# Patient Record
Sex: Female | Born: 2000 | Hispanic: Yes | Marital: Single | Smoking: Never smoker
Health system: Southern US, Community
[De-identification: ages and names within clinical notes are randomized; demographics above are authoritative.]

## PROBLEM LIST (undated history)

## (undated) DIAGNOSIS — Q676 Pectus excavatum: Secondary | ICD-10-CM

## (undated) DIAGNOSIS — D270 Benign neoplasm of right ovary: Principal | ICD-10-CM

## (undated) DIAGNOSIS — R1900 Intra-abdominal and pelvic swelling, mass and lump, unspecified site: Secondary | ICD-10-CM

## (undated) DIAGNOSIS — M419 Scoliosis, unspecified: Secondary | ICD-10-CM

## (undated) DIAGNOSIS — Z789 Other specified health status: Secondary | ICD-10-CM

## (undated) HISTORY — DX: Pectus excavatum: Q67.6

## (undated) HISTORY — DX: Benign neoplasm of right ovary: D27.0

## (undated) HISTORY — DX: Other specified health status: Z78.9

## (undated) HISTORY — DX: Scoliosis, unspecified: M41.9

## (undated) HISTORY — PX: NO PAST SURGERIES: SHX2092

---
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
## 2018-01-01 ENCOUNTER — Ambulatory Visit (HOSPITAL_COMMUNITY)
Admission: RE | Admit: 2018-01-01 | Discharge: 2018-01-01 | Disposition: A | Discharge status: Home / Self Care | Payer: Medicaid Other | Source: Ambulatory Visit | Attending: Obstetrics and Gynecology | Admitting: Obstetrics and Gynecology

## 2018-01-01 ENCOUNTER — Other Ambulatory Visit: Payer: Self-pay | Admitting: Obstetrics and Gynecology

## 2018-01-01 DIAGNOSIS — N83201 Unspecified ovarian cyst, right side: Secondary | ICD-10-CM

## 2018-01-02 ENCOUNTER — Telehealth: Payer: Self-pay | Admitting: General Practice

## 2018-01-02 DIAGNOSIS — N83201 Unspecified ovarian cyst, right side: Secondary | ICD-10-CM

## 2018-01-02 NOTE — Telephone Encounter (Signed)
Called patient with pac[employer] interpreter [phone] & informed her of results and need to come in for blood work. Also discussed someone from the front office will contact her with a follow up appt. Patient verbalized understanding to all and states she can come tomorrow at 2pm. Patient had no questions. Orders placed.

## 2018-01-02 NOTE — Telephone Encounter (Signed)
-----   Message from Chancy Milroy, MD sent at 01/01/2018  2:51 PM EST ----- Please let pt know that the ovarian cyst remains. She needs to come in for blood work and additional management after blood work returns Please draw AFP, LDH, CA 125, CEA and inhibin. Thanks Legrand Como

## 2018-01-03 ENCOUNTER — Other Ambulatory Visit: Payer: Medicaid Other

## 2018-01-03 DIAGNOSIS — N83201 Unspecified ovarian cyst, right side: Secondary | ICD-10-CM

## 2018-01-08 LAB — AFP TUMOR MARKER

## 2018-01-08 LAB — CEA: CEA: 0.6 ng/mL (ref 0.0–4.7)

## 2018-01-08 LAB — LACTATE DEHYDROGENASE: LDH: 175 IU/L (ref 119–226)

## 2018-01-08 LAB — INHIBIN A: Inhibin A, Ultrasensitive: 6.3 pg/mL

## 2018-01-08 LAB — INHIBIN B: Inhibin B: 31.8 pg/mL

## 2018-01-08 LAB — CA 125: Cancer Antigen (CA) 125: 23.6 U/mL (ref 0.0–38.1)

## 2018-01-09 ENCOUNTER — Telehealth: Payer: Self-pay | Admitting: *Deleted

## 2018-01-09 ENCOUNTER — Telehealth: Payer: Self-pay

## 2018-01-09 ENCOUNTER — Telehealth: Payer: Self-pay | Admitting: Obstetrics and Gynecology

## 2018-01-09 NOTE — Telephone Encounter (Signed)
VM received from Dr Rip Harbour regarding referral for patient to see Dr Denman George.  Will notify Joylene John NP to review and then return his call with appt.

## 2018-01-09 NOTE — Telephone Encounter (Signed)
Called the patient using [employer], gave the patient the date/time and address for the appt on 11/27 at [place] at 8:30am.

## 2018-01-09 NOTE — Telephone Encounter (Signed)
Pt contacted with recent U/S and lab results. Discussed with pt that d/t to right ovarian cyst not resolving further evaluation needed. Concern for possible malignance briefly reviewed. Will refer pt to GYN/ONC for further evaluation.  Pt verbalized understanding.

## 2018-01-16 ENCOUNTER — Ambulatory Visit: Payer: Medicaid Other | Admitting: Obstetrics and Gynecology

## 2018-01-16 ENCOUNTER — Encounter: Payer: Self-pay | Admitting: Obstetrics and Gynecology

## 2018-01-22 NOTE — H&P (View-Only) (Signed)
[place] at [facility] Note: New Patient FIRST VISIT   Consult was requested by Dr. Selmer Dominion for an enlarging complex pelvic mass with elevated testosterone levels  Chief Complaint  Patient presents with  . Pelvic mass    GYN Oncologic Summary 1. TBD o .  HPI: Ms. Beth Reed  is a very nice 17 y.o. P0  May 2019 she noticed that her menstrual cycle stopped initially thinking she was pregnant she followed up in June.  Pregnancy test was negative but a transvaginal ultrasound revealed an ovarian cyst.  Given the change in menstrual cycle several hormonal levels were obtained.  Notably on 09/12/2017 a total testosterone was elevated at 170.  A free testosterone was elevated at 11.6.   09/20/2017 had a pelvic ultrasound (not transvag) showing an ovarian cyst, was started on OCP about this time.  Several tumor markers were obtained and those were normal as noted below.  Repeat testosterone level on the day of the ultrasound 09/20/2017 after which time she had started oral contraceptives revealed a total testosterone of 139 and a free testosterone of 9.   Followup US 01/01/18 again done pelvic not transvaginally revealed a right ovary Measurements: 15.4 x 7.5 x 12.4 cm = volume: 744 mL. A complex cystic mass is again seen in the right adnexa which contains several mural soft tissue nodules. This lesion measures 15.4 x 7.5 x 12.4 cm, and has increased in size compared to 9.6 x 8.4 x 8.9 cm on previous study   She is therefore referred for management and recommendations.  Since starting oral contraceptives she now has regular menstrual cycle.  She has a good appetite denies pain denies nausea and vomiting denies any changes in her bowels or bladder habits.  She denies any masculinization symptoms.  She does note bloating.  She had a weight loss if 5 pounds in July otherwise no weight loss.  Imported EPIC Oncologic History:   No  history exists.    Measurement of disease: Recent Labs    01/03/18 1345  CAN125 23.6  INHBB 31.8  AFP, LDH, InhA/B, CA125, CEA, DHEAS all normal between July and November 2019   Testosterone, Total (upper normal = 45) 09/12/2017 = 170 09/20/2017 = 139   Testosterone, Free (upper normal = 6.4) 09/12/2017 = 11.6 09/20/2017 = 9  Radiology:   09/20/17 - TVUS COMPARISON:  None.FINDINGS:UterusMeasurements: 7.4 x 3.1 x 3.7 cm. No fibroids or other massvisualized.EndometriumThickness: 3 mm in thickness.  No focal abnormality visualized.Right ovaryMeasurements: 10.3 x 9.1 x 11.9 cm. 9.6 x 8.4 x 8.9 cm complexcystic mass within the right ovary. Thin internal septations.Internalechoes noted. There is mural nodularity with a soft tissuecomponent noted along the anterior aspect of the cystic massmeasuring up to 4 cm.Left ovaryMeasurements: 4.4 x 2.7 x 4.4 cm. Normal appearance/no adnexal mass.Other findings:  No abnormal free fluid.IMPRESSION:Large complex right ovarian cystic mass measuring up to9.6 cm.There is a moderate-sized soft tissue mural component/nodulemeasuring up to 4 cm. Recommend surgical consultation for possiblesurgical excision.Electronically Signed  By: Rolm Baptise M.D.  On: 09/20/2017 14:06 US Pelvis (transabdominal Only)  Result Date: 01/01/2018 CLINICAL DATA:  Follow-up complex cystic right adnexal mass. EXAM: TRANSABDOMINAL ULTRASOUND OF PELVIS TECHNIQUE: Transabdominal ultrasound examination of the pelvis was performed including evaluation of the uterus, ovaries, adnexal regions, and pelvic cul-de-sac. COMPARISON:  09/20/2017 FINDINGS: Uterus Measurements: 7.2 x 2.6 x 4.1 cm = volume: 40.7 mL. No fibroids or other mass visualized. Endometrium Thickness:  2 mm.  No focal abnormality visualized. Right ovary Measurements: 15.4 x 7.5 x 12.4 cm = volume: 744 mL. A complex cystic mass is again seen in the right adnexa which contains several mural soft tissue nodules. This lesion  measures 15.4 x 7.5 x 12.4 cm, and has increased in size compared to 9.6 x 8.4 x 8.9 cm on previous study. This is highly suspicious for a cystic ovarian neoplasm. Left ovary Measurements: 4.7 x 1.9 x 2.2 cm = volume: 10.2 mL. Normal appearance/no adnexal mass. Other findings:  No abnormal free fluid. IMPRESSION: 15.4 cm complex cystic mass in right adnexa with several mural soft tissue nodules. This is increased in size since prior study, suspicious for ovarian cystadenocarcinoma. Recommend correlation with tumor markers and referral for surgical consultation. Normal appearance of uterus and left ovary. Electronically Signed   By: Earle Gell M.D.   On: 01/01/2018 13:59   .   Outpatient Encounter Medications as of 01/23/2018  Medication Sig  . norgestimate-ethinyl estradiol (SPRINTEC 28) 0.25-35 MG-MCG tablet Take 1 tablet by mouth daily.   No facility-administered encounter medications on file as of 01/23/2018.    No Known Allergies  Past Medical History:  Diagnosis Date  . Medical history non-contributory    Past Surgical History:  Procedure Laterality Date  . NO PAST SURGERIES          Past Gynecological History:   GYNECOLOGIC HISTORY:  . No LMP recorded.  . Menarche: 17 years old . P 0 . Contraceptive current OCP . HRT NA  . Last Pap NA Family Hx:  Family History  Problem Relation Age of Onset  . Hypertension Mother   . Liver cancer Father   . Diabetes Maternal Grandmother   . Colon cancer Other   . Cancer Other        eye  . Breast cancer Other    Social Hx:  Marland Kitchen Tobacco use: none . Alcohol use: none . Illicit Drug use: none . Illicit IV Drug use: none    Review of Systems: Review of Systems  Gastrointestinal: Positive for abdominal distention.  All other systems reviewed and are negative.   Vitals:  Vitals:   01/23/18 0853  BP: 121/84  Pulse: 90  Resp: 18  Temp: 97.7 F (36.5 C)  SpO2: 100%   Vitals:   01/23/18 0853  Weight: 101 lb 12.8 oz (46.2 kg)   Height: 5\' 4"  (1.626 m)   Body mass index is 17.47 kg/m.  Physical Exam: General :  Well developed, 17 y.o., female in no apparent distress HEENT:  Normocephalic/atraumatic, symmetric, EOMI, eyelids normal Neck:   Supple, no masses.  Lymphatics:  No cervical/ submandibular/ supraclavicular/ infraclavicular/ inguinal adenopathy Respiratory:  Respirations unlabored, no use of accessory muscles CV:   Deferred Breast:  Deferred Musculoskeletal: No CVA tenderness, normal muscle strength. Abdomen:  Large palpable mass in lower abdomen, mobilizes.  Soft, non-tender Extremities:  No lymphedema, no erythema, non-tender. Skin:   Normal inspection. Normal hair growth Neuro/Psych:  No focal motor deficit, no abnormal mental status. Normal gait. Normal affect. Alert and oriented to person, place, and time  Genito Urinary: Vulva: Normal external female genitalia. . No obvious clitoromegaly Bladder/urethra: Urethral meatus normal in size and location. No lesions or   masses, well supported bladder Speculum exam: Vagina: No lesion, no discharge, no bleeding. Cervix: Displaced by mass, difficult to visualize, but palpates normal  Bimanual exam:  Uterus: Difficult to differentiate from mass.   Adnexal region: Fullness in  culdesac which elevates on exam Rectovaginal:  Deferred   Assessment  Complex pelvic mass in 17yo with elevated testosterone and h/o amenorrhea ECOG PERFORMANCE STATUS: 1 - Symptomatic but completely ambulatory  Plan  1. Complexity of visit ? This is a new problem and additional management is planned ? Data reviewed ?  I independently reviewed the images and the radiology reports and discussed my interpretation in the presence of the patient and her mother today ? The mass is complex with an area superiorly that is concerning. Unfortunately the images were transabdominal and I suspect the doppler studies are invalid. There was no doppler flow in the solid portion of the  mass. ? I reviewed her referring doctor's office notes and I have summarized in the HPI ? History was obtained from the patient through the help of the interpreter ? We reviewed the tumor marker studies including the elevated testosterone which makes me concerned for Sertoli-Ledig ? This is an undiagnosed new problem with uncertain prognosis  ? Management will include major surgery 2. Preop items pending ? She needs CT AP imaging as thus far only a transabdominal pelvic sono was done ? I will repeat the testosterone levels, which may be lower due to the OCP ? Preop CXR  3. Surgical discussion ? I recommend resection and likely this will mean a USO. ? We discussed her remaining ovary will serve to function for ovulation/fertility ? The procedure risks, benefits, and alternatives were reviewed using the surgical sketch with the help of the interpreter and she was given a copy at the end of the visit. 4. She is in school and so we will plan to take her to surgery just before the Christmas break. Hopefully she can complete this semester and start the next without delay.  Face to face time with patient was 60 minutes. Over 50% of this time was spent on counseling and coordination of care.   Mart Piggs, MD Gynecologic Oncologist 01/24/2018, 10:02 PM    Cc: Arlina Robes, MD (Referring Ob/Gyn) Scot Jun, FNP (PCP)

## 2018-01-22 NOTE — Progress Notes (Signed)
[place] at [facility] Note: New Patient FIRST VISIT   Consult was requested by Dr. Selmer Dominion for an enlarging complex pelvic mass with elevated testosterone levels  Chief Complaint  Patient presents with  . Pelvic mass    GYN Oncologic Summary 1. TBD o .  HPI: Ms. Beth Reed  is a very nice 17 y.o. P0  May 2019 she noticed that her menstrual cycle stopped initially thinking she was pregnant she followed up in June.  Pregnancy test was negative but a transvaginal ultrasound revealed an ovarian cyst.  Given the change in menstrual cycle several hormonal levels were obtained.  Notably on 09/12/2017 a total testosterone was elevated at 170.  A free testosterone was elevated at 11.6.   09/20/2017 had a pelvic ultrasound (not transvag) showing an ovarian cyst, was started on OCP about this time.  Several tumor markers were obtained and those were normal as noted below.  Repeat testosterone level on the day of the ultrasound 09/20/2017 after which time she had started oral contraceptives revealed a total testosterone of 139 and a free testosterone of 9.   Followup US 01/01/18 again done pelvic not transvaginally revealed a right ovary Measurements: 15.4 x 7.5 x 12.4 cm = volume: 744 mL. A complex cystic mass is again seen in the right adnexa which contains several mural soft tissue nodules. This lesion measures 15.4 x 7.5 x 12.4 cm, and has increased in size compared to 9.6 x 8.4 x 8.9 cm on previous study   She is therefore referred for management and recommendations.  Since starting oral contraceptives she now has regular menstrual cycle.  She has a good appetite denies pain denies nausea and vomiting denies any changes in her bowels or bladder habits.  She denies any masculinization symptoms.  She does note bloating.  She had a weight loss if 5 pounds in July otherwise no weight loss.  Imported EPIC Oncologic History:   No  history exists.    Measurement of disease: Recent Labs    01/03/18 1345  CAN125 23.6  INHBB 31.8  AFP, LDH, InhA/B, CA125, CEA, DHEAS all normal between July and November 2019   Testosterone, Total (upper normal = 45) 09/12/2017 = 170 09/20/2017 = 139   Testosterone, Free (upper normal = 6.4) 09/12/2017 = 11.6 09/20/2017 = 9  Radiology:   09/20/17 - TVUS COMPARISON:  None.FINDINGS:UterusMeasurements: 7.4 x 3.1 x 3.7 cm. No fibroids or other massvisualized.EndometriumThickness: 3 mm in thickness.  No focal abnormality visualized.Right ovaryMeasurements: 10.3 x 9.1 x 11.9 cm. 9.6 x 8.4 x 8.9 cm complexcystic mass within the right ovary. Thin internal septations.Internalechoes noted. There is mural nodularity with a soft tissuecomponent noted along the anterior aspect of the cystic massmeasuring up to 4 cm.Left ovaryMeasurements: 4.4 x 2.7 x 4.4 cm. Normal appearance/no adnexal mass.Other findings:  No abnormal free fluid.IMPRESSION:Large complex right ovarian cystic mass measuring up to9.6 cm.There is a moderate-sized soft tissue mural component/nodulemeasuring up to 4 cm. Recommend surgical consultation for possiblesurgical excision.Electronically Signed  By: Rolm Baptise M.D.  On: 09/20/2017 14:06 US Pelvis (transabdominal Only)  Result Date: 01/01/2018 CLINICAL DATA:  Follow-up complex cystic right adnexal mass. EXAM: TRANSABDOMINAL ULTRASOUND OF PELVIS TECHNIQUE: Transabdominal ultrasound examination of the pelvis was performed including evaluation of the uterus, ovaries, adnexal regions, and pelvic cul-de-sac. COMPARISON:  09/20/2017 FINDINGS: Uterus Measurements: 7.2 x 2.6 x 4.1 cm = volume: 40.7 mL. No fibroids or other mass visualized. Endometrium Thickness:  2 mm.  No focal abnormality visualized. Right ovary Measurements: 15.4 x 7.5 x 12.4 cm = volume: 744 mL. A complex cystic mass is again seen in the right adnexa which contains several mural soft tissue nodules. This lesion  measures 15.4 x 7.5 x 12.4 cm, and has increased in size compared to 9.6 x 8.4 x 8.9 cm on previous study. This is highly suspicious for a cystic ovarian neoplasm. Left ovary Measurements: 4.7 x 1.9 x 2.2 cm = volume: 10.2 mL. Normal appearance/no adnexal mass. Other findings:  No abnormal free fluid. IMPRESSION: 15.4 cm complex cystic mass in right adnexa with several mural soft tissue nodules. This is increased in size since prior study, suspicious for ovarian cystadenocarcinoma. Recommend correlation with tumor markers and referral for surgical consultation. Normal appearance of uterus and left ovary. Electronically Signed   By: Earle Gell M.D.   On: 01/01/2018 13:59   .   Outpatient Encounter Medications as of 01/23/2018  Medication Sig  . norgestimate-ethinyl estradiol (SPRINTEC 28) 0.25-35 MG-MCG tablet Take 1 tablet by mouth daily.   No facility-administered encounter medications on file as of 01/23/2018.    No Known Allergies  Past Medical History:  Diagnosis Date  . Medical history non-contributory    Past Surgical History:  Procedure Laterality Date  . NO PAST SURGERIES          Past Gynecological History:   GYNECOLOGIC HISTORY:  . No LMP recorded.  . Menarche: 17 years old . P 0 . Contraceptive current OCP . HRT NA  . Last Pap NA Family Hx:  Family History  Problem Relation Age of Onset  . Hypertension Mother   . Liver cancer Father   . Diabetes Maternal Grandmother   . Colon cancer Other   . Cancer Other        eye  . Breast cancer Other    Social Hx:  Marland Kitchen Tobacco use: none . Alcohol use: none . Illicit Drug use: none . Illicit IV Drug use: none    Review of Systems: Review of Systems  Gastrointestinal: Positive for abdominal distention.  All other systems reviewed and are negative.   Vitals:  Vitals:   01/23/18 0853  BP: 121/84  Pulse: 90  Resp: 18  Temp: 97.7 F (36.5 C)  SpO2: 100%   Vitals:   01/23/18 0853  Weight: 101 lb 12.8 oz (46.2 kg)   Height: 5\' 4"  (1.626 m)   Body mass index is 17.47 kg/m.  Physical Exam: General :  Well developed, 17 y.o., female in no apparent distress HEENT:  Normocephalic/atraumatic, symmetric, EOMI, eyelids normal Neck:   Supple, no masses.  Lymphatics:  No cervical/ submandibular/ supraclavicular/ infraclavicular/ inguinal adenopathy Respiratory:  Respirations unlabored, no use of accessory muscles CV:   Deferred Breast:  Deferred Musculoskeletal: No CVA tenderness, normal muscle strength. Abdomen:  Large palpable mass in lower abdomen, mobilizes.  Soft, non-tender Extremities:  No lymphedema, no erythema, non-tender. Skin:   Normal inspection. Normal hair growth Neuro/Psych:  No focal motor deficit, no abnormal mental status. Normal gait. Normal affect. Alert and oriented to person, place, and time  Genito Urinary: Vulva: Normal external female genitalia. . No obvious clitoromegaly Bladder/urethra: Urethral meatus normal in size and location. No lesions or   masses, well supported bladder Speculum exam: Vagina: No lesion, no discharge, no bleeding. Cervix: Displaced by mass, difficult to visualize, but palpates normal  Bimanual exam:  Uterus: Difficult to differentiate from mass.   Adnexal region: Fullness in  culdesac which elevates on exam Rectovaginal:  Deferred   Assessment  Complex pelvic mass in 17yo with elevated testosterone and h/o amenorrhea ECOG PERFORMANCE STATUS: 1 - Symptomatic but completely ambulatory  Plan  1. Complexity of visit ? This is a new problem and additional management is planned ? Data reviewed ?  I independently reviewed the images and the radiology reports and discussed my interpretation in the presence of the patient and her mother today ? The mass is complex with an area superiorly that is concerning. Unfortunately the images were transabdominal and I suspect the doppler studies are invalid. There was no doppler flow in the solid portion of the  mass. ? I reviewed her referring doctor's office notes and I have summarized in the HPI ? History was obtained from the patient through the help of the interpreter ? We reviewed the tumor marker studies including the elevated testosterone which makes me concerned for Sertoli-Ledig ? This is an undiagnosed new problem with uncertain prognosis  ? Management will include major surgery 2. Preop items pending ? She needs CT AP imaging as thus far only a transabdominal pelvic sono was done ? I will repeat the testosterone levels, which may be lower due to the OCP ? Preop CXR  3. Surgical discussion ? I recommend resection and likely this will mean a USO. ? We discussed her remaining ovary will serve to function for ovulation/fertility ? The procedure risks, benefits, and alternatives were reviewed using the surgical sketch with the help of the interpreter and she was given a copy at the end of the visit. 4. She is in school and so we will plan to take her to surgery just before the Christmas break. Hopefully she can complete this semester and start the next without delay.  Face to face time with patient was 60 minutes. Over 50% of this time was spent on counseling and coordination of care.   Mart Piggs, MD Gynecologic Oncologist 01/24/2018, 10:02 PM    Cc: Arlina Robes, MD (Referring Ob/Gyn) Scot Jun, FNP (PCP)

## 2018-01-23 ENCOUNTER — Inpatient Hospital Stay: Payer: Medicaid Other | Attending: Obstetrics | Admitting: Obstetrics

## 2018-01-23 ENCOUNTER — Ambulatory Visit: Payer: Medicaid Other

## 2018-01-23 ENCOUNTER — Encounter: Payer: Self-pay | Admitting: Obstetrics

## 2018-01-23 VITALS — BP 121/84 | HR 90 | Temp 97.7°F | Resp 18 | Ht 64.0 in | Wt 101.8 lb

## 2018-01-23 DIAGNOSIS — R978 Other abnormal tumor markers: Secondary | ICD-10-CM | POA: Insufficient documentation

## 2018-01-23 DIAGNOSIS — N83201 Unspecified ovarian cyst, right side: Secondary | ICD-10-CM | POA: Diagnosis present

## 2018-01-23 NOTE — Patient Instructions (Signed)
Preparing for your Surgery  Plan for surgery on February 13, 2018 with Dr. Precious Haws at [facility] will be scheduled for a exploratory laparotomy, unilateral salpingo-oophorectomy, possible staging.   Pre-operative Testing -You will receive a phone call from presurgical testing at [facility] to arrange for a pre-operative testing appointment before your surgery.  This appointment normally occurs one to two weeks before your scheduled surgery.   -Bring your insurance card, copy of an advanced directive if applicable, medication list  -At that visit, you will be asked to sign a consent for a possible blood transfusion in case a transfusion becomes necessary during surgery.  The need for a blood transfusion is rare but having consent is a necessary part of your care.     -You should not be taking blood thinners or aspirin at least ten days prior to surgery unless instructed by your surgeon.  Day Before Surgery at [place] will be asked to take in a light diet the day before surgery.  Avoid carbonated beverages.  You will be advised to have nothing to eat or drink after midnight the evening before.    Eat a light diet the day before surgery.  Examples including soups, broths, toast, yogurt, mashed potatoes.  Things to avoid include carbonated beverages (fizzy beverages), raw fruits and raw vegetables, or beans.   If your bowels are filled with gas, your surgeon will have difficulty visualizing your pelvic organs which increases your surgical risks.  Your role in recovery Your role is to become active as soon as directed by your doctor, while still giving yourself time to heal.  Rest when you feel tired. You will be asked to do the following in order to speed your recovery:  - Cough and breathe deeply. This helps toclear and expand your lungs and can prevent pneumonia. You may be given a spirometer to practice deep breathing. A staff member will show you  how to use the spirometer. - Do mild physical activity. Walking or moving your legs help your circulation and body functions return to normal. A staff member will help you when you try to walk and will provide you with simple exercises. Do not try to get up or walk alone the first time. - Actively manage your pain. Managing your pain lets you move in comfort. We will ask you to rate your pain on a scale of zero to 10. It is your responsibility to tell your doctor or nurse where and how much you hurt so your pain can be treated.  Special Considerations -If you are diabetic, you may be placed on insulin after surgery to have closer control over your blood sugars to promote healing and recovery.  This does not mean that you will be discharged on insulin.  If applicable, your oral antidiabetics will be resumed when you are tolerating a solid diet.  -Your final pathology results from surgery should be available around one week after surgery and the results will be relayed to you when available.  -Dr. Everitt Amber is the Surgeon that assists your GYN Oncologist with surgery.  The next day after your surgery you will either see your GYN Oncologist, Dr. Everitt Amber, or Dr. Lahoma Crocker.  -FMLA forms can be faxed to [phone] and please allow 5-7 business days for completion.   Blood Transfusion Information WHAT IS A BLOOD TRANSFUSION? A transfusion is the replacement of blood or some of its parts. Blood is made up of multiple cells  which provide different functions.  Red blood cells carry oxygen and are used for blood loss replacement.  White blood cells fight against infection.  Platelets control bleeding.  Plasma helps clot blood.  Other blood products are available for specialized needs, such as hemophilia or other clotting disorders. BEFORE THE TRANSFUSION  Who gives blood for transfusions?   You may be able to donate blood to be used at a later date on yourself (autologous  donation).  Relatives can be asked to donate blood. This is generally not any safer than if you have received blood from a stranger. The same precautions are taken to ensure safety when a relative's blood is donated.  Healthy volunteers who are fully evaluated to make sure their blood is safe. This is blood bank blood. Transfusion therapy is the safest it has ever been in the practice of medicine. Before blood is taken from a donor, a complete history is taken to make sure that person has no history of diseases nor engages in risky social behavior (examples are intravenous drug use or sexual activity with multiple partners). The donor's travel history is screened to minimize risk of transmitting infections, such as malaria. The donated blood is tested for signs of infectious diseases, such as HIV and hepatitis. The blood is then tested to be sure it is compatible with you in order to minimize the chance of a transfusion reaction. If you or a relative donates blood, this is often done in anticipation of surgery and is not appropriate for emergency situations. It takes many days to process the donated blood. RISKS AND COMPLICATIONS Although transfusion therapy is very safe and saves many lives, the main dangers of transfusion include:   Getting an infectious disease.  Developing a transfusion reaction. This is an allergic reaction to something in the blood you were given. Every precaution is taken to prevent this. The decision to have a blood transfusion has been considered carefully by your caregiver before blood is given. Blood is not given unless the benefits outweigh the risks.

## 2018-01-30 ENCOUNTER — Ambulatory Visit (HOSPITAL_COMMUNITY)
Admission: RE | Admit: 2018-01-30 | Discharge: 2018-01-30 | Disposition: A | Discharge status: Home / Self Care | Payer: Medicaid Other | Source: Ambulatory Visit | Attending: Obstetrics | Admitting: Obstetrics

## 2018-01-30 DIAGNOSIS — N83201 Unspecified ovarian cyst, right side: Secondary | ICD-10-CM | POA: Insufficient documentation

## 2018-01-30 MED ORDER — IOHEXOL 300 MG/ML  SOLN
75.0000 mL | Freq: Once | INTRAMUSCULAR | Status: AC | PRN
  Administered 2018-01-30: 75 mL via INTRAVENOUS

## 2018-01-30 MED ORDER — SODIUM CHLORIDE (PF) 0.9 % IJ SOLN
INTRAMUSCULAR | Status: AC
  Filled 2018-01-30: qty 50

## 2018-02-04 ENCOUNTER — Telehealth: Payer: Self-pay

## 2018-02-04 NOTE — Telephone Encounter (Signed)
Told Beth Beth Reed that the CT Scan  for her abdomen and pelvis showed the right ovarian cyst which is known.  There is no other significant abnormality with in the abdomen and pelvis per Melissa Cross,NP.

## 2018-02-11 NOTE — Patient Instructions (Addendum)
Yesenia Locurto  02/11/2018   Your procedure is scheduled on: 02-13-18   Report to [facility] Main  Entrance  Report to admitting at 1230 pm    Call this number if you have problems the morning of surgery [phone]   Remember: Do not eat food  :After Midnight. BRUSH YOUR TEETH MORNING OF SURGERY AND RINSE YOUR MOUTH OUT, NO CHEWING GUM CANDY OR MINTS.  NO SOLID FOOD AFTER MIDNIGHT THE NIGHT PRIOR TO SURGERY. NOTHING BY MOUTH EXCEPT CLEAR LIQUIDS UNTIL 3 HOURS PRIOR TO [place] SURGERY. PLEASE FINISH ENSURE DRINK PER SURGEON ORDER 3 HOURS PRIOR TO SCHEDULED SURGERY TIME WHICH NEEDS TO BE COMPLETED AT 1130 am.  Eat a light diet the day  TODAY:  Examples including soups, broths, toast, yogurt, mashed potatoes.  Things to avoid include carbonated beverages (fizzy beverages), raw fruits and raw vegetables, or beans.   If your bowels are filled with gas, your surgeon will have difficulty visualizing your pelvic organs which increases your surgical risks.  CLEAR LIQUID DIET   Foods Allowed                                                                     Foods Excluded  Coffee and tea, regular and decaf                             liquids that you cannot  Plain Jell-O in any flavor                                             see through such as: Fruit ices (not with fruit pulp)                                     milk, soups, orange juice  Iced Popsicles                                    All solid food Carbonated beverages, regular and diet                                    Cranberry, grape and apple juices Sports drinks like Gatorade Lightly seasoned clear broth or consume(fat free) Sugar, honey syrup  Sample Menu Breakfast                                Lunch                                     Supper Cranberry juice                    Beef  broth                            Chicken broth Jell-O                                     Grape juice                            Apple juice Coffee or tea                        Jell-O                                      Popsicle                                                Coffee or tea                        Coffee or tea  _____________________________________________________________________    Take these medicines the morning of surgery with A SIP OF WATER: SPRINTEC                                 You may not have any metal on your body including hair pins and              piercings  Do not wear jewelry, make-up, lotions, powders or perfumes, deodorant             Do not wear nail polish.  Do not shave  48 hours prior to surgery.              Do not bring valuables to the hospital. [place].  Contacts, dentures or bridgework may not be worn into surgery.  Leave suitcase in the car. After surgery it may be brought to your room.                 Please read over the following fact sheets you were given: _____________________________________________________________________  [facility] - Preparing for Surgery Before surgery, you can play an important role.  Because skin is not sterile, your skin needs to be as free of germs as possible.  You can reduce the number of germs on your skin by washing with CHG (chlorahexidine gluconate) soap before surgery.  CHG is an antiseptic cleaner which kills germs and bonds with the skin to continue killing germs even after washing. Please DO NOT use if you have an allergy to CHG or antibacterial soaps.  If your skin becomes reddened/irritated stop using the CHG and inform your nurse when you arrive at Short Stay. Do not shave (including legs and underarms) for at least 48 hours prior to the first CHG shower.  You may shave your face/neck. Please follow these instructions carefully:  1.  Shower with CHG Soap the night before surgery and the  morning of  Surgery.  2.  If you choose to wash your hair, wash your hair  first as usual with your  normal  shampoo.  3.  After you shampoo, rinse your hair and body thoroughly to remove the  shampoo.                           4.  Use CHG as you would any other liquid soap.  You can apply chg directly  to the skin and wash                       Gently with a scrungie or clean washcloth.  5.  Apply the CHG Soap to your body ONLY FROM THE NECK DOWN.   Do not use on face/ open                           Wound or open sores. Avoid contact with eyes, ears mouth and genitals (private parts).                       Wash face,  Genitals (private parts) with your normal soap.             6.  Wash thoroughly, paying special attention to the area where your surgery  will be performed.  7.  Thoroughly rinse your body with warm water from the neck down.  8.  DO NOT shower/wash with your normal soap after using and rinsing off  the CHG Soap.                9.  Pat yourself dry with a clean towel.            10.  Wear clean pajamas.            11.  Place clean sheets on your bed the night of your first shower and do not  sleep with pets. Day of Surgery : Do not apply any lotions/deodorants the morning of surgery.  Please wear clean clothes to the hospital/surgery center.  FAILURE TO FOLLOW THESE INSTRUCTIONS MAY RESULT IN THE CANCELLATION OF YOUR SURGERY PATIENT SIGNATURE_________________________________  NURSE SIGNATURE__________________________________  ________________________________________________________________________   Adam Phenix  An incentive spirometer is a tool that can help keep your lungs clear and active. This tool measures how well you are filling your lungs with each breath. Taking long deep breaths may help reverse or decrease the chance of developing breathing (pulmonary) problems (especially infection) following:  A long period of time when you are unable to move or be active. BEFORE THE PROCEDURE   If the spirometer includes an indicator to  show your best effort, your nurse or respiratory therapist will set it to a desired goal.  If possible, sit up straight or lean slightly forward. Try not to slouch.  Hold the incentive spirometer in an upright position. INSTRUCTIONS FOR USE  1. Sit on the edge of your bed if possible, or sit up as far as you can in bed or on a chair. 2. Hold the incentive spirometer in an upright position. 3. Breathe out normally. 4. Place the mouthpiece in your mouth and seal your lips tightly around it. 5. Breathe in slowly and as deeply as possible, raising the piston or the ball toward the top of the column. 6. Hold your breath for 3-5 seconds or for as long as possible. Allow  the piston or ball to fall to the bottom of the column. 7. Remove the mouthpiece from your mouth and breathe out normally. 8. Rest for a few seconds and repeat Steps 1 through 7 at least 10 times every 1-2 hours when you are awake. Take your time and take a few normal breaths between deep breaths. 9. The spirometer may include an indicator to show your best effort. Use the indicator as a goal to work toward during each repetition. 10. After each set of 10 deep breaths, practice coughing to be sure your lungs are clear. If you have an incision (the cut made at the time of surgery), support your incision when coughing by placing a pillow or rolled up towels firmly against it. Once you are able to get out of bed, walk around indoors and cough well. You may stop using the incentive spirometer when instructed by your caregiver.  RISKS AND COMPLICATIONS  Take your time so you do not get dizzy or light-headed.  If you are in pain, you may need to take or ask for pain medication before doing incentive spirometry. It is harder to take a deep breath if you are having pain. AFTER USE  Rest and breathe slowly and easily.  It can be helpful to keep track of a log of your progress. Your caregiver can provide you with a simple table to help with  this. If you are using the spirometer at home, follow these instructions: [place] IF:   You are having difficultly using the spirometer.  You have trouble using the spirometer as often as instructed.  Your pain medication is not giving enough relief while using the spirometer.  You develop fever of 100.5 F (38.1 C) or higher. SEEK IMMEDIATE MEDICAL CARE IF:   You cough up bloody sputum that had not been present before.  You develop fever of 102 F (38.9 C) or greater.  You develop worsening pain at or near the incision site. MAKE SURE YOU:   Understand these instructions.  Will watch your condition.  Will get help right away if you are not doing well or get worse. Document Released: 06/26/2006 Document Revised: 05/08/2011 Document Reviewed: 08/27/2006 ExitCare Patient Information 2014 Exit[employer], [place].   ________________________________________________________________________  WHAT IS A BLOOD TRANSFUSION? Blood Transfusion Information  A transfusion is the replacement of blood or some of its parts. Blood is made up of multiple cells which provide different functions.  Red blood cells carry oxygen and are used for blood loss replacement.  White blood cells fight against infection.  Platelets control bleeding.  Plasma helps clot blood.  Other blood products are available for specialized needs, such as hemophilia or other clotting disorders. BEFORE THE TRANSFUSION  Who gives blood for transfusions?   Healthy volunteers who are fully evaluated to make sure their blood is safe. This is blood bank blood. Transfusion therapy is the safest it has ever been in the practice of medicine. Before blood is taken from a donor, a complete history is taken to make sure that person has no history of diseases nor engages in risky social behavior (examples are intravenous drug use or sexual activity with multiple partners). The donor's travel history is screened to minimize  risk of transmitting infections, such as malaria. The donated blood is tested for signs of infectious diseases, such as HIV and hepatitis. The blood is then tested to be sure it is compatible with you in order to minimize the chance of a transfusion reaction.  If you or a relative donates blood, this is often done in anticipation of surgery and is not appropriate for emergency situations. It takes many days to process the donated blood. RISKS AND COMPLICATIONS Although transfusion therapy is very safe and saves many lives, the main dangers of transfusion include:   Getting an infectious disease.  Developing a transfusion reaction. This is an allergic reaction to something in the blood you were given. Every precaution is taken to prevent this. The decision to have a blood transfusion has been considered carefully by your caregiver before blood is given. Blood is not given unless the benefits outweigh the risks. AFTER THE TRANSFUSION  Right after receiving a blood transfusion, you will usually feel much better and more energetic. This is especially true if your red blood cells have gotten low (anemic). The transfusion raises the level of the red blood cells which carry oxygen, and this usually causes an energy increase.  The nurse administering the transfusion will monitor you carefully for complications. HOME CARE INSTRUCTIONS  No special instructions are needed after a transfusion. You may find your energy is better. Speak with your caregiver about any limitations on activity for underlying diseases you may have. SEEK MEDICAL CARE IF:   Your condition is not improving after your transfusion.  You develop redness or irritation at the intravenous (IV) site. SEEK IMMEDIATE MEDICAL CARE IF:  Any of the following symptoms occur over the next 12 hours:  Shaking chills.  You have a temperature by mouth above 102 F (38.9 C), not controlled by medicine.  Chest, back, or muscle pain.  People  around you feel you are not acting correctly or are confused.  Shortness of breath or difficulty breathing.  Dizziness and fainting.  You get a rash or develop hives.  You have a decrease in urine output.  Your urine turns a dark color or changes to pink, red, or brown. Any of the following symptoms occur over the next 10 days:  You have a temperature by mouth above 102 F (38.9 C), not controlled by medicine.  Shortness of breath.  Weakness after normal activity.  The white part of the eye turns yellow (jaundice).  You have a decrease in the amount of urine or are urinating less often.  Your urine turns a dark color or changes to pink, red, or brown. Document Released: 02/11/2000 Document Revised: 05/08/2011 Document Reviewed: 09/30/2007 [facility] Patient Information 2014 [place], [place].  _______________________________________________________________________

## 2018-02-12 ENCOUNTER — Other Ambulatory Visit: Payer: Self-pay

## 2018-02-12 ENCOUNTER — Encounter (HOSPITAL_COMMUNITY): Payer: Self-pay

## 2018-02-12 ENCOUNTER — Ambulatory Visit (HOSPITAL_COMMUNITY)
Admission: RE | Admit: 2018-02-12 | Discharge: 2018-02-12 | Disposition: A | Discharge status: Home / Self Care | Payer: Medicaid Other | Source: Ambulatory Visit | Attending: Gynecologic Oncology | Admitting: Gynecologic Oncology

## 2018-02-12 ENCOUNTER — Encounter (HOSPITAL_COMMUNITY)
Admission: RE | Admit: 2018-02-12 | Discharge: 2018-02-12 | Disposition: A | Discharge status: Home / Self Care | Payer: Medicaid Other | Source: Ambulatory Visit | Attending: Obstetrics | Admitting: Obstetrics

## 2018-02-12 DIAGNOSIS — R1900 Intra-abdominal and pelvic swelling, mass and lump, unspecified site: Secondary | ICD-10-CM

## 2018-02-12 HISTORY — DX: Intra-abdominal and pelvic swelling, mass and lump, unspecified site: R19.00

## 2018-02-12 LAB — COMPREHENSIVE METABOLIC PANEL
ALT: 14 U/L (ref 0–44)
AST: 20 U/L (ref 15–41)
Albumin: 4.6 g/dL (ref 3.5–5.0)
Alkaline Phosphatase: 50 U/L (ref 38–126)
Anion gap: 9 (ref 5–15)
BUN: 11 mg/dL (ref 6–20)
CO2: 23 mmol/L (ref 22–32)
Calcium: 9.3 mg/dL (ref 8.9–10.3)
Chloride: 104 mmol/L (ref 98–111)
Creatinine, Ser: 0.61 mg/dL (ref 0.44–1.00)
GFR calc Af Amer: >60 mL/min (ref >60)
Glucose, Bld: 50 mg/dL — ABNORMAL LOW (ref 70–99)
Potassium: 4.2 mmol/L (ref 3.5–5.1)
Sodium: 136 mmol/L (ref 135–145)
Total Bilirubin: 0.5 mg/dL (ref 0.3–1.2)
Total Protein: 7.9 g/dL (ref 6.5–8.1)

## 2018-02-12 LAB — URINALYSIS, ROUTINE W REFLEX MICROSCOPIC
Bilirubin Urine: NEGATIVE
Glucose, UA: NEGATIVE mg/dL
Ketones, ur: NEGATIVE mg/dL
Nitrite: NEGATIVE
Protein, ur: NEGATIVE mg/dL
Specific Gravity, Urine: 1.015 (ref 1.005–1.030)
pH: 5 (ref 5.0–8.0)

## 2018-02-12 LAB — CBC
HCT: 43.8 % (ref 36.0–46.0)
Hemoglobin: 13.5 g/dL (ref 12.0–15.0)
MCH: 26.1 pg (ref 26.0–34.0)
MCHC: 30.8 g/dL (ref 30.0–36.0)
MCV: 84.7 fL (ref 80.0–100.0)
Platelets: 303 10*3/uL (ref 150–400)
RBC: 5.17 MIL/uL — ABNORMAL HIGH (ref 3.87–5.11)
RDW: 13.1 % (ref 11.5–15.5)
WBC: 5.5 10*3/uL (ref 4.0–10.5)
nRBC: 0 % (ref 0.0–0.2)

## 2018-02-12 LAB — PREGNANCY, URINE: PREG TEST UR: NEGATIVE

## 2018-02-12 LAB — ABO/RH: ABO/RH(D): O POS

## 2018-02-12 NOTE — Progress Notes (Signed)
Spoke with patient by [employer] [facility]manuel number [phone] and asked patient is she ate breakfast before coming for lab work today since blood glucose was 50 at pre op lab. Patient stated eating breakfast and stated she feels fine and no dizziness or claminess noted. Patient instructed to eat good today with pre op diet discussed to avoid low blood sugar tomorrow for surgery. Note placed on front of chart to check cbg when patient arrives due to low blood glucose today.

## 2018-02-13 ENCOUNTER — Inpatient Hospital Stay (HOSPITAL_BASED_OUTPATIENT_CLINIC_OR_DEPARTMENT_OTHER): Payer: Medicaid Other | Admitting: Anesthesiology

## 2018-02-13 ENCOUNTER — Encounter (HOSPITAL_COMMUNITY)
Admission: RE | Disposition: A | Discharge status: Home / Self Care | Payer: Self-pay | Source: Home / Self Care | Attending: Obstetrics

## 2018-02-13 ENCOUNTER — Other Ambulatory Visit: Payer: Self-pay

## 2018-02-13 ENCOUNTER — Inpatient Hospital Stay (HOSPITAL_BASED_OUTPATIENT_CLINIC_OR_DEPARTMENT_OTHER)
Admission: RE | Admit: 2018-02-13 | Discharge: 2018-02-14 | DRG: 358 | Disposition: A | Discharge status: Home / Self Care | Payer: Medicaid Other | Attending: Obstetrics | Admitting: Obstetrics

## 2018-02-13 ENCOUNTER — Encounter (HOSPITAL_COMMUNITY): Payer: Self-pay | Admitting: Anesthesiology

## 2018-02-13 DIAGNOSIS — Z833 Family history of diabetes mellitus: Secondary | ICD-10-CM

## 2018-02-13 DIAGNOSIS — Z8249 Family history of ischemic heart disease and other diseases of the circulatory system: Secondary | ICD-10-CM

## 2018-02-13 DIAGNOSIS — Z800 Family history of malignant neoplasm of digestive organs: Secondary | ICD-10-CM

## 2018-02-13 DIAGNOSIS — R1900 Intra-abdominal and pelvic swelling, mass and lump, unspecified site: Principal | ICD-10-CM | POA: Diagnosis present

## 2018-02-13 DIAGNOSIS — N83201 Unspecified ovarian cyst, right side: Secondary | ICD-10-CM | POA: Diagnosis present

## 2018-02-13 DIAGNOSIS — D270 Benign neoplasm of right ovary: Secondary | ICD-10-CM

## 2018-02-13 HISTORY — PX: SALPINGOOPHORECTOMY: SHX82

## 2018-02-13 HISTORY — DX: Benign neoplasm of right ovary: D27.0

## 2018-02-13 LAB — GLUCOSE, CAPILLARY: GLUCOSE-CAPILLARY: 87 mg/dL (ref 70–99)

## 2018-02-13 LAB — TYPE AND SCREEN
ABO/RH(D): O POS
ANTIBODY SCREEN: NEGATIVE

## 2018-02-13 LAB — URINE CULTURE: Culture: NO GROWTH

## 2018-02-13 LAB — TESTOSTERONE: Testosterone: 38 ng/dL

## 2018-02-13 LAB — TESTOSTERONE, FREE: Testosterone, Free: 1.2 pg/mL

## 2018-02-13 SURGERY — SALPINGO-OOPHORECTOMY, OPEN
Anesthesia: General | Laterality: Right

## 2018-02-13 MED ORDER — FENTANYL CITRATE (PF) 100 MCG/2ML IJ SOLN
INTRAMUSCULAR | Status: AC
  Administered 2018-02-13: 25 ug via INTRAVENOUS
  Filled 2018-02-13: qty 2

## 2018-02-13 MED ORDER — HYDROMORPHONE HCL 1 MG/ML IJ SOLN
0.5000 mg | INTRAMUSCULAR | Status: DC | PRN
  Filled 2018-02-13: qty 0.5

## 2018-02-13 MED ORDER — BUPIVACAINE LIPOSOME 1.3 % IJ SUSP
20.0000 mL | Freq: Once | INTRAMUSCULAR | Status: AC
  Administered 2018-02-13: 20 mL
  Filled 2018-02-13: qty 20

## 2018-02-13 MED ORDER — SUGAMMADEX SODIUM 200 MG/2ML IV SOLN
INTRAVENOUS | Status: DC | PRN
  Administered 2018-02-13: 100 mg via INTRAVENOUS

## 2018-02-13 MED ORDER — DIPHENHYDRAMINE HCL 12.5 MG/5ML PO ELIX: 12.5000 mg | ORAL_SOLUTION | Freq: Four times a day (QID) | ORAL | Status: DC | PRN

## 2018-02-13 MED ORDER — DEXAMETHASONE SODIUM PHOSPHATE 10 MG/ML IJ SOLN
INTRAMUSCULAR | Status: AC
  Filled 2018-02-13: qty 1

## 2018-02-13 MED ORDER — ONDANSETRON HCL 4 MG/2ML IJ SOLN
INTRAMUSCULAR | Status: AC
  Filled 2018-02-13: qty 2

## 2018-02-13 MED ORDER — OXYCODONE HCL 5 MG PO TABS
5.0000 mg | ORAL_TABLET | ORAL | Status: DC | PRN
  Administered 2018-02-13 – 2018-02-14 (×2): 5 mg via ORAL
  Filled 2018-02-13 (×2): qty 1

## 2018-02-13 MED ORDER — ONDANSETRON HCL 4 MG PO TABS: 4.0000 mg | ORAL_TABLET | Freq: Four times a day (QID) | ORAL | Status: DC | PRN

## 2018-02-13 MED ORDER — LIDOCAINE 2% (20 MG/ML) 5 ML SYRINGE
INTRAMUSCULAR | Status: AC
  Filled 2018-02-13: qty 5

## 2018-02-13 MED ORDER — PROPOFOL 10 MG/ML IV BOLUS
INTRAVENOUS | Status: AC
  Filled 2018-02-13: qty 20

## 2018-02-13 MED ORDER — SUGAMMADEX SODIUM 200 MG/2ML IV SOLN
INTRAVENOUS | Status: AC
  Filled 2018-02-13: qty 2

## 2018-02-13 MED ORDER — SODIUM CHLORIDE 0.9 % IR SOLN
Status: DC | PRN
  Administered 2018-02-13: 1000 mL

## 2018-02-13 MED ORDER — PROMETHAZINE HCL 25 MG/ML IJ SOLN: 6.2500 mg | INTRAMUSCULAR | Status: DC | PRN

## 2018-02-13 MED ORDER — SCOPOLAMINE 1 MG/3DAYS TD PT72
1.0000 | MEDICATED_PATCH | TRANSDERMAL | Status: DC
  Administered 2018-02-13: 1.5 mg via TRANSDERMAL
  Filled 2018-02-13: qty 1

## 2018-02-13 MED ORDER — GABAPENTIN 300 MG PO CAPS
300.0000 mg | ORAL_CAPSULE | ORAL | Status: AC
  Administered 2018-02-13: 300 mg via ORAL
  Filled 2018-02-13: qty 1

## 2018-02-13 MED ORDER — ACETAMINOPHEN 500 MG PO TABS
1000.0000 mg | ORAL_TABLET | ORAL | Status: AC
  Administered 2018-02-13: 1000 mg via ORAL
  Filled 2018-02-13: qty 2

## 2018-02-13 MED ORDER — CELECOXIB 200 MG PO CAPS: 200.0000 mg | ORAL_CAPSULE | Freq: Two times a day (BID) | ORAL | Status: DC

## 2018-02-13 MED ORDER — DEXAMETHASONE SODIUM PHOSPHATE 4 MG/ML IJ SOLN: 4.0000 mg | INTRAMUSCULAR | Status: DC

## 2018-02-13 MED ORDER — DIPHENHYDRAMINE HCL 50 MG/ML IJ SOLN: 12.5000 mg | Freq: Four times a day (QID) | INTRAMUSCULAR | Status: DC | PRN

## 2018-02-13 MED ORDER — MIDAZOLAM HCL 2 MG/2ML IJ SOLN
INTRAMUSCULAR | Status: AC
  Filled 2018-02-13: qty 2

## 2018-02-13 MED ORDER — LACTATED RINGERS IV SOLN
INTRAVENOUS | Status: DC
  Administered 2018-02-13 (×2): via INTRAVENOUS

## 2018-02-13 MED ORDER — ONDANSETRON HCL 4 MG/2ML IJ SOLN: 4.0000 mg | Freq: Four times a day (QID) | INTRAMUSCULAR | Status: DC | PRN

## 2018-02-13 MED ORDER — CELECOXIB 200 MG PO CAPS
400.0000 mg | ORAL_CAPSULE | ORAL | Status: AC
  Administered 2018-02-13: 400 mg via ORAL
  Filled 2018-02-13: qty 2

## 2018-02-13 MED ORDER — FENTANYL CITRATE (PF) 100 MCG/2ML IJ SOLN
25.0000 ug | INTRAMUSCULAR | Status: DC | PRN
  Administered 2018-02-13: 25 ug via INTRAVENOUS
  Administered 2018-02-13: 50 ug via INTRAVENOUS

## 2018-02-13 MED ORDER — LIDOCAINE HCL 1 % IJ SOLN
INTRAMUSCULAR | Status: DC | PRN
  Administered 2018-02-13: 20 mL

## 2018-02-13 MED ORDER — CEFAZOLIN SODIUM-DEXTROSE 2-4 GM/100ML-% IV SOLN
2.0000 g | INTRAVENOUS | Status: DC
  Filled 2018-02-13: qty 100

## 2018-02-13 MED ORDER — ROCURONIUM BROMIDE 10 MG/ML (PF) SYRINGE
PREFILLED_SYRINGE | INTRAVENOUS | Status: AC
  Filled 2018-02-13: qty 10

## 2018-02-13 MED ORDER — ONDANSETRON HCL 4 MG/2ML IJ SOLN
INTRAMUSCULAR | Status: DC | PRN
  Administered 2018-02-13: 4 mg via INTRAVENOUS

## 2018-02-13 MED ORDER — KETOROLAC TROMETHAMINE 15 MG/ML IJ SOLN
15.0000 mg | Freq: Three times a day (TID) | INTRAMUSCULAR | Status: DC
  Administered 2018-02-13: 15 mg via INTRAVENOUS
  Filled 2018-02-13 (×2): qty 1

## 2018-02-13 MED ORDER — LIDOCAINE HCL (CARDIAC) PF 100 MG/5ML IV SOSY
PREFILLED_SYRINGE | INTRAVENOUS | Status: DC | PRN
  Administered 2018-02-13: 40 mg via INTRAVENOUS

## 2018-02-13 MED ORDER — PROPOFOL 10 MG/ML IV BOLUS
INTRAVENOUS | Status: DC | PRN
  Administered 2018-02-13: 100 mg via INTRAVENOUS

## 2018-02-13 MED ORDER — GABAPENTIN 300 MG PO CAPS
300.0000 mg | ORAL_CAPSULE | Freq: Two times a day (BID) | ORAL | Status: DC
  Administered 2018-02-13 – 2018-02-14 (×2): 300 mg via ORAL
  Filled 2018-02-13 (×2): qty 1

## 2018-02-13 MED ORDER — FENTANYL CITRATE (PF) 100 MCG/2ML IJ SOLN
INTRAMUSCULAR | Status: DC | PRN
  Administered 2018-02-13: 100 ug via INTRAVENOUS

## 2018-02-13 MED ORDER — MIDAZOLAM HCL 5 MG/5ML IJ SOLN
INTRAMUSCULAR | Status: DC | PRN
  Administered 2018-02-13: 2 mg via INTRAVENOUS

## 2018-02-13 MED ORDER — KCL IN DEXTROSE-NACL 20-5-0.45 MEQ/L-%-% IV SOLN
INTRAVENOUS | Status: DC
  Administered 2018-02-13: 18:00:00 via INTRAVENOUS
  Filled 2018-02-13 (×2): qty 1000

## 2018-02-13 MED ORDER — CEFAZOLIN SODIUM-DEXTROSE 2-4 GM/100ML-% IV SOLN: 2.0000 g | INTRAVENOUS | Status: DC

## 2018-02-13 MED ORDER — DEXAMETHASONE SODIUM PHOSPHATE 4 MG/ML IJ SOLN
4.0000 mg | INTRAMUSCULAR | Status: AC
  Administered 2018-02-13: 5 mg via INTRAVENOUS

## 2018-02-13 MED ORDER — ZOLPIDEM TARTRATE 5 MG PO TABS: 5.0000 mg | ORAL_TABLET | Freq: Every evening | ORAL | Status: DC | PRN

## 2018-02-13 MED ORDER — FENTANYL CITRATE (PF) 100 MCG/2ML IJ SOLN
INTRAMUSCULAR | Status: AC
  Filled 2018-02-13: qty 2

## 2018-02-13 MED ORDER — ROCURONIUM BROMIDE 100 MG/10ML IV SOLN
INTRAVENOUS | Status: DC | PRN
  Administered 2018-02-13: 30 mg via INTRAVENOUS

## 2018-02-13 SURGICAL SUPPLY — 37 items
BLADE EXTENDED COATED 6.5IN (ELECTRODE) ×4 IMPLANT
CHLORAPREP W/TINT 26ML (MISCELLANEOUS) ×4 IMPLANT
CLIP VESOLOCK LG 6/CT PURPLE (CLIP) IMPLANT
CLIP VESOLOCK MED 6/CT (CLIP) IMPLANT
COVER WAND RF STERILE (DRAPES) ×4 IMPLANT
DERMABOND ADVANCED (GAUZE/BANDAGES/DRESSINGS) ×2
DERMABOND ADVANCED .7 DNX12 (GAUZE/BANDAGES/DRESSINGS) ×2 IMPLANT
DRAIN CHANNEL 15F RND FF 3/16 (WOUND CARE) IMPLANT
DRAPE UNDERBUTTOCKS STRL (DRAPE) ×4 IMPLANT
DRSG TELFA 3X8 NADH (GAUZE/BANDAGES/DRESSINGS) IMPLANT
EVACUATOR SILICONE 100CC (DRAIN) IMPLANT
GOWN STRL REUS W/TWL LRG LVL3 (GOWN DISPOSABLE) ×12 IMPLANT
MARKER SKIN DUAL TIP RULER LAB (MISCELLANEOUS) IMPLANT
PAD OB MATERNITY 4.3X12.25 (PERSONAL CARE ITEMS) ×4 IMPLANT
RTRCTR WOUND ALEXIS 18CM SML (INSTRUMENTS) ×4
SAVER CELL AAL HAEMONETICS (INSTRUMENTS) ×2 IMPLANT
SEPRAFILM MEMBRANE 5X6 (MISCELLANEOUS) IMPLANT
SPONGE SURGIFOAM ABS GEL 100 (HEMOSTASIS) IMPLANT
STAPLER VISISTAT 35W (STAPLE) IMPLANT
SUT MNCRL AB 4-0 PS2 18 (SUTURE) IMPLANT
SUT PDS AB 0 CTX 36 PDP370T (SUTURE) IMPLANT
SUT PDS AB 1 TP1 54 (SUTURE) IMPLANT
SUT PDS AB 3-0 SH 27 (SUTURE) IMPLANT
SUT PROLENE 0 CT 2 (SUTURE) IMPLANT
SUT PROLENE 2 0 BLUE (SUTURE) ×4 IMPLANT
SUT VIC AB 0 CT1 18XCR BRD 8 (SUTURE) ×2 IMPLANT
SUT VIC AB 0 CT1 36 (SUTURE) ×8 IMPLANT
SUT VIC AB 0 CT1 8-18 (SUTURE) ×2
SUT VIC AB 0 CTX 27 (SUTURE) IMPLANT
SUT VIC AB 3-0 PS2 18 (SUTURE)
SUT VIC AB 3-0 PS2 18XBRD (SUTURE) IMPLANT
SUT VIC AB 4-0 PS2 18 (SUTURE) IMPLANT
SUT VIC AB 4-0 PS2 27 (SUTURE) ×4 IMPLANT
TOWEL OR 17X26 10 PK STRL BLUE (TOWEL DISPOSABLE) ×4 IMPLANT
TOWEL OR NON WOVEN STRL DISP B (DISPOSABLE) ×4 IMPLANT
TRAY FOLEY MTR SLVR 16FR STAT (SET/KITS/TRAYS/PACK) ×4 IMPLANT
YANKAUER SUCT BULB TIP 10FT TU (MISCELLANEOUS) ×4 IMPLANT

## 2018-02-13 NOTE — Interval H&P Note (Signed)
History and Physical Interval Note:  02/13/2018 1:22 PM  Beth Reed  has presented today for surgery, with the diagnosis of PELVIC MASS  The various methods of treatment have been discussed with the patient and family. After consideration of risks, benefits and other options for treatment, the patient has consented to  Procedure(s): EXPLORATORY LAPAROTOMY POSSIBLE STAGING (N/A) [name] (Bilateral) as a surgical intervention .  The patient's history has been reviewed, patient examined, no change in status, stable for surgery.  I have reviewed the patient's chart and labs.  Questions were answered to the patient's satisfaction.     [name]

## 2018-02-13 NOTE — Op Note (Signed)
OPERATIVE NOTE  Date: 02/13/18  Preoperative Diagnosis:  Large adnexal mass   Postoperative Diagnosis: Same    Procedure(s) Performed: Exploratory laparotomy with right salpingo-oophorectomy. Pelvic washings  Surgeon: Mart Piggs, MD.  Assistant Surgeon: Everitt Amber, MD Assistant: (an MD assistant was necessary for tissue manipulation, retraction and positioning due to the complexity of the case and hospital policies).   Specimens: right tube and ovary, pelvic washings   Estimated Blood Loss: 10 mL.    Urine JSEGBT:517OH  Complications: None.   Operative Findings: Large smooth walled right ovarian neoplasm. No excrescences. Aspiration of clear fluid allowed for delivery through minilaparotomy. Normal left tube/ovary/uterus/peritoneum, omentum.   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  Procedure:   The patient was taken to the operating room. General anesthesia was induced without difficulty. Time out was performed verifying the procedure as USO, possible staging.   She was prepped and draped in the normal sterile fashion in the dorsal lithotomy position in padded Allen stirrups with good attention paid to support of the lower back and lower extremities. Position was adjusted for appropriate support. A Foley catheter was placed to gravity by me.   A midline low vertical incision was made and carried through the subcutaneous tissue to the fascia. The fascial incision was made and extended superiorally. The rectus muscles were separated. The peritoneum was identified and entered without incident. Peritoneal incision was extended longitudinally.  A self-retaining Alexis retractor was then placed. A survey of the abdomen and pelvis revealed the above findings.  Washings were obtained from the peritoneal cavity.  The cystic mass was identified and a purse-string 2-0 prolene suture placed. A gallbladder trochar was used to puncture the cyst in the middle of the pursestring a  the suture was tied to prevent spillage. The gallbladder trocar was used to evacuate clear fluid and decompress the mass. No gross spillage of cyst fluid took place.   The deflated cystic mass was delivered through the abdominal incision.  A window was made in the medial leaf of the broad ligament above the level of the ureter. The  utero-ovarian ligament and infundibulopelvic ligament were skeletonized, clamped, transected, and ligated with 0-vicryl suture. This liberated the ovary from its attachments. 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 Hemostasis was observed at the utero-ovarian and IP ligaments.  The ureter was identified in the medial leaf of the broad ligament deep to the surgical pedicles. The uterus and contralateral ovary were inspected and noted to be normal.  The peritoneal cavity was irrigated and hemostasis was confirmed at all surgical sites.  The fascia was reapproximated with #1 looped PDS using a total of two sutures. The subcutaneous layer was then irrigated copiously.  Exparel long acting local anesthetic was infiltrated into the subcutaneous tissues. The skin was closed with subcuticular suture, interrupted 4-0 vicryl in the dermis then 4-0 monocryl in a running fashion. The patient tolerated the procedure well.   Sponge, lap and needle counts were correct x 2.

## 2018-02-13 NOTE — Anesthesia Postprocedure Evaluation (Signed)
Anesthesia Post Note  Patient: Paulette Rockford  Procedure(s) Performed: LAPAROTOMY RIGHT SALPINGO OOPHORECTOMY (Right )     Patient location during evaluation: PACU Anesthesia Type: General Level of consciousness: awake and alert, awake and oriented Pain management: pain level controlled Vital Signs Assessment: post-procedure vital signs reviewed and stable Respiratory status: spontaneous breathing, nonlabored ventilation and respiratory function stable Cardiovascular status: blood pressure returned to baseline and stable Postop Assessment: no apparent nausea or vomiting Anesthetic complications: no    Last Vitals:  Vitals:   02/13/18 1657 02/13/18 1751  BP: 131/82 123/86  Pulse: 77 71  Resp: 12 17  Temp: 36.4 C (!) 36.4 C  SpO2: 100% 100%    Last Pain:  Vitals:   02/13/18 1751  TempSrc: Oral  PainSc:                  Catalina Gravel

## 2018-02-13 NOTE — Transfer of Care (Signed)
Immediate Anesthesia Transfer of Care Note  Patient: Beth Reed  Procedure(s) Performed: Procedure(s) (LRB): LAPAROTOMY RIGHT SALPINGO OOPHORECTOMY (Right)  Patient Location: PACU  Anesthesia Type: General  Level of Consciousness: awake, sedated, patient cooperative and responds to stimulation  Airway & Oxygen Therapy: Patient Spontanous Breathing and Patient connected to face mask oxygen  Post-op Assessment: Report given to PACU RN, Post -op Vital signs reviewed and stable and Patient moving all extremities  Post vital signs: Reviewed and stable  Complications: No apparent anesthesia complications

## 2018-02-13 NOTE — Anesthesia Procedure Notes (Signed)
Procedure Name: Intubation Date/Time: 02/13/2018 1:55 PM Performed by: Justice Rocher, CRNA Pre-anesthesia Checklist: Patient identified, Emergency Drugs available, Suction available and Patient being monitored Patient Re-evaluated:Patient Re-evaluated prior to induction Oxygen Delivery Method: Circle system utilized Preoxygenation: Pre-oxygenation with 100% oxygen Induction Type: IV induction Ventilation: Mask ventilation without difficulty Laryngoscope Size: Mac and 3 Grade View: Grade I Tube type: Oral Tube size: 7.0 mm Number of attempts: 1 Airway Equipment and Method: Stylet and Oral airway Placement Confirmation: ETT inserted through vocal cords under direct vision,  positive ETCO2 and breath sounds checked- equal and bilateral Secured at: 22 cm Tube secured with: Tape Dental Injury: Teeth and Oropharynx as per pre-operative assessment

## 2018-02-13 NOTE — Anesthesia Preprocedure Evaluation (Addendum)
Anesthesia Evaluation  Patient identified by MRN, date of birth, ID band Patient awake    Reviewed: Allergy & Precautions, NPO status , Patient's Chart, lab work & pertinent test results  Airway Mallampati: I  TM Distance: >3 FB Neck ROM: Full    Dental  (+) Teeth Intact, Dental Advisory Given   Pulmonary neg pulmonary ROS,    Pulmonary exam normal breath sounds clear to auscultation       Cardiovascular Exercise Tolerance: Good negative cardio ROS Normal cardiovascular exam Rhythm:Regular Rate:Normal     Neuro/Psych negative neurological ROS  negative psych ROS   GI/Hepatic negative GI ROS, Neg liver ROS,   Endo/Other  negative endocrine ROS  Renal/GU negative Renal ROS     Musculoskeletal negative musculoskeletal ROS (+)   Abdominal   Peds  Hematology negative hematology ROS (+)   Anesthesia Other Findings Day of surgery medications reviewed with the patient.  Reproductive/Obstetrics PELVIC MASS                            Anesthesia Physical Anesthesia Plan  ASA: I  Anesthesia Plan: General   Post-op Pain Management:    Induction: Intravenous  PONV Risk Score and Plan: 4 or greater and Midazolam, Dexamethasone, Ondansetron and Diphenhydramine  Airway Management Planned: Oral ETT  Additional Equipment:   Intra-op Plan:   Post-operative Plan: Extubation in OR  Informed Consent: I have reviewed the patients History and Physical, chart, labs and discussed the procedure including the risks, benefits and alternatives for the proposed anesthesia with the patient or authorized representative who has indicated his/her understanding and acceptance.   Dental advisory given  Plan Discussed with: CRNA  Anesthesia Plan Comments: (OCP use--will reverse paralytic with neostigmine!)       Anesthesia Quick Evaluation

## 2018-02-13 NOTE — Discharge Instructions (Signed)
02/13/2018  Return to work/school: Discuss with Dr. Gerarda Fraction on followup  Activity: 1. Be up and out of the bed during the day.  Take a nap if needed.  You may walk up steps but be careful and use the hand rail.  Stair climbing will tire you more than you think, you may need to stop part way and rest.   2. No lifting, pulling, pushing, or straining anything more than 5 pounds for 6 weeks. You may be able to return to full activity at 4 weeks, but this will be further discussed at your postoperative visits.  3. No driving for 2 weeks.  Do Not drive if you are taking narcotic pain medicine.  4. Shower daily.  Use soap and water on your incision and pat dry; don't rub.   5. No sexual activity and nothing in the vagina for 4 weeks.  Medications:  - As long as you have never been told to avoid ibuprofen and/or tylenol use these first for pain control. Take these regularly (every 6 hours) to decrease the build up of pain.  - If necessary, for severe pain not relieved by the above, take your pain prescription.  - While taking your prescription pain medication you should take stool softener (I prefer you purchase docusate sodium "Colace") 2-3 times per day to reduce the likelihood of constipation. If this causes diarrhea, stop its use.  Diet: 1. Low sodium Heart Healthy Diet is recommended. 2. It is safe to use a gentle laxative if you have difficulty moving your bowels as long as you have no nausea or vomiting and you are passing flatus.  Wound Care: 1. Keep clean and dry.  Shower daily. 2. If you have steri-strips on your incision these will fall off after 2-3 weeks. At 3 weeks you may take these off carefully after showering.   Reasons to call the Doctor:   Fever - Oral temperature greater than 100.4 degrees Fahrenheit  Foul-smelling vaginal discharge  Difficulty urinating  Nausea and vomiting  Increased pain at the site of the incision that is unrelieved with pain  medicine.  Difficulty breathing with or without chest pain  New calf pain especially if only on one side  Sudden, continuing increased vaginal bleeding/drainage with or without clots.   Follow-up: 1. See Dr. Gerarda Fraction in 1-2 weeks.  Contacts: For questions or concerns you should contact:  Our office [phone] After hours and on week-ends for urgent issues relating to our treatment for you, please call [phone] and ask to speak to the physician on call for Gynecologic Oncology  We will not refill pain medications if we are not your primary provider or after hours. Plan accordingly if you are running low.

## 2018-02-14 ENCOUNTER — Encounter (HOSPITAL_BASED_OUTPATIENT_CLINIC_OR_DEPARTMENT_OTHER): Payer: Self-pay | Admitting: Obstetrics

## 2018-02-14 LAB — CBC
HCT: 34.7 % — ABNORMAL LOW (ref 36.0–46.0)
Hemoglobin: 11 g/dL — ABNORMAL LOW (ref 12.0–15.0)
MCH: 27.1 pg (ref 26.0–34.0)
MCHC: 31.7 g/dL (ref 30.0–36.0)
MCV: 85.5 fL (ref 80.0–100.0)
NRBC: 0 % (ref 0.0–0.2)
Platelets: 232 10*3/uL (ref 150–400)
RBC: 4.06 MIL/uL (ref 3.87–5.11)
RDW: 13 % (ref 11.5–15.5)
WBC: 14.2 10*3/uL — AB (ref 4.0–10.5)

## 2018-02-14 LAB — BASIC METABOLIC PANEL
Anion gap: 7 (ref 5–15)
BUN: 9 mg/dL (ref 6–20)
CO2: 21 mmol/L — ABNORMAL LOW (ref 22–32)
Calcium: 8.9 mg/dL (ref 8.9–10.3)
Chloride: 108 mmol/L (ref 98–111)
Creatinine, Ser: 0.57 mg/dL (ref 0.44–1.00)
GFR calc Af Amer: >60 mL/min (ref >60)
GFR calc non Af Amer: >60 mL/min (ref >60)
Glucose, Bld: 119 mg/dL — ABNORMAL HIGH (ref 70–99)
Potassium: 4.9 mmol/L (ref 3.5–5.1)
SODIUM: 136 mmol/L (ref 135–145)

## 2018-02-14 MED ORDER — IBUPROFEN 800 MG PO TABS: 800.0000 mg | ORAL_TABLET | Freq: Three times a day (TID) | ORAL | 0 refills | Status: AC | PRN

## 2018-02-14 MED ORDER — SENNA 8.6 MG PO TABS: 1.0000 | ORAL_TABLET | Freq: Every day | ORAL | 0 refills | Status: AC

## 2018-02-14 MED ORDER — OXYCODONE HCL 5 MG PO TABS: 5.0000 mg | ORAL_TABLET | ORAL | 0 refills | Status: DC | PRN

## 2018-02-14 NOTE — Progress Notes (Signed)
Discharge instructions discussed with family, verbalized agreement and understanding,

## 2018-02-14 NOTE — Discharge Summary (Signed)
Physician Discharge Summary  Patient ID: Beth Reed MRN: [id number] DOB/AGE: Jan 30, 2001 17 y.o.  Admit date: 02/13/2018 Discharge date: 02/14/2018  Admission Diagnoses: <principal problem not specified>  Discharge Diagnoses:  Active Problems:   Right ovarian cyst   Pelvic mass in female   Discharged Condition: good  Hospital Course:  1/ patient was admitted on 02/13/18 for a minilap with left salpingo-oophorectomy for a large dermoid cyst. 2/ surgery was uncomplicated  3/ on postoperative day 1 the patient was meeting discharge criteria: tolerating PO, voiding urine, ambulating, pain well controlled on oral medications.  4/ new medications on discharge include percocet, ibuprofen and sennakot.  Consults: None  Significant Diagnostic Studies: labs:  CBC    Component Value Date/Time   WBC 14.2 (H) 02/14/2018 0432   RBC 4.06 02/14/2018 0432   HGB 11.0 (L) 02/14/2018 0432   HCT 34.7 (L) 02/14/2018 0432   PLT 232 02/14/2018 0432   MCV 85.5 02/14/2018 0432   MCH 27.1 02/14/2018 0432   MCHC 31.7 02/14/2018 0432   RDW 13.0 02/14/2018 0432   LYMPHSABS 2,775 01/26/2016 1628   MONOABS 999 (H) 01/26/2016 1628   EOSABS 444 01/26/2016 1628   BASOSABS 111 01/26/2016 1628     Treatments: surgery: see above  Discharge Exam: Blood pressure 109/67, pulse 67, temperature 98.7 F (37.1 C), temperature source Oral, resp. rate 16, height 5\' 4"  (1.626 m), weight 103 lb 9.6 oz (47 kg), last menstrual period 02/08/2018, SpO2 100 %. General appearance: alert and cooperative GI: soft, non-tender; bowel sounds normal; no masses,  no organomegaly Incision/Wound: clean and intact with dermabond  Disposition: Discharge disposition: 01-Home or Self Care       Discharge Instructions    (HEART FAILURE PATIENTS) Call MD:  Anytime you have any of the following symptoms: 1) 3 pound weight gain in 24 hours or 5 pounds in 1 week 2) shortness of breath, with or without a dry hacking  cough 3) swelling in the hands, feet or stomach 4) if you have to sleep on extra pillows at night in order to breathe.   Complete by:  As directed    Call MD for:  difficulty breathing, headache or visual disturbances   Complete by:  As directed    Call MD for:  extreme fatigue   Complete by:  As directed    Call MD for:  hives   Complete by:  As directed    Call MD for:  persistant dizziness or light-headedness   Complete by:  As directed    Call MD for:  persistant nausea and vomiting   Complete by:  As directed    Call MD for:  redness, tenderness, or signs of infection (pain, swelling, redness, odor or green/yellow discharge around incision site)   Complete by:  As directed    Call MD for:  severe uncontrolled pain   Complete by:  As directed    Call MD for:  temperature >100.4   Complete by:  As directed    Diet - low sodium heart healthy   Complete by:  As directed    Diet general   Complete by:  As directed    Driving Restrictions   Complete by:  As directed    No driving for 7 days or until off narcotic pain medication   Increase activity slowly   Complete by:  As directed    Remove dressing in 24 hours   Complete by:  As directed    Sexual Activity  Restrictions   Complete by:  As directed    No intercourse for 6 weeks     Allergies as of 02/14/2018   No Known Allergies     Medication List    TAKE these medications   ibuprofen 800 MG tablet Commonly known as:  ADVIL,MOTRIN Take 1 tablet (800 mg total) by mouth every 8 (eight) hours as needed.   norgestimate-ethinyl estradiol 0.25-35 MG-MCG tablet Commonly known as:  SPRINTEC 28 Take 1 tablet by mouth daily. What changed:    when to take this  additional instructions   oxyCODONE 5 MG immediate release tablet Commonly known as:  Oxy IR/ROXICODONE Take 1-2 tablets (5-10 mg total) by mouth every 4 (four) hours as needed for moderate pain.   senna 8.6 MG Tabs tablet Commonly known as:  SENOKOT Take 1  tablet (8.6 mg total) by mouth at bedtime.      Follow-up Information    [name], MD.   Specialty:  Gynecologic Oncology Contact information: [place] [address] [phone]           Signed: Thereasa Solo 02/14/2018, 12:38 PM

## 2018-02-14 NOTE — Progress Notes (Signed)
1 Day Post-Op Procedure(s) (LRB): LAPAROTOMY RIGHT SALPINGO OOPHORECTOMY (Right)  Subjective: Patient reports feeling hot especially on the back of her neck.  Tolerating liquids and solid food.  Has not had breakfast this am but no nausea or emesis reported. Incisional pain minimal except when moving.  Voiding without difficulty. States she may be up for going home later today if able to tolerate solid food this am and if upper right shoulder discomfort improves. Mother at the bedside.  Objective: Vital signs in last 24 hours: Temp:  [97.5 F (36.4 C)-99.3 F (37.4 C)] 98.5 F (36.9 C) (12/19 0537) Pulse Rate:  [62-103] 62 (12/19 0537) Resp:  [11-18] 16 (12/19 0537) BP: (103-136)/(57-98) 103/57 (12/19 0537) SpO2:  [99 %-100 %] 100 % (12/19 0537) Weight:  [103 lb 9.6 oz (47 kg)] 103 lb 9.6 oz (47 kg) (12/18 1254)    Intake/Output from previous day: 12/18 0701 - 12/19 0700 In: 2771 [P.O.:720; I.V.:2051] Out: 2310 [Urine:2300; Blood:10]  Physical Examination: General: alert, cooperative and no distress Resp: clear to auscultation bilaterally Cardio: regular rate and rhythm, S1, S2 normal, no murmur, click, rub or gallop GI: soft, non-tender; bowel sounds normal; no masses,  no organomegaly and incision: mini laparotomy incision with dermabond without erythema or drainage Extremities: extremities normal, atraumatic, no cyanosis or edema  Back of neck feels slightly warm but patient covered in blankets  Labs: WBC/Hgb/Hct/Plts:  14.2/11.0/34.7/232 (12/19 0432) BUN/Cr/glu/ALT/AST/amyl/lip:  9/0.57/--/--/--/--/-- (12/19 6578)  Assessment: 17 y.o. s/p Procedure(s): LAPAROTOMY RIGHT SALPINGO OOPHORECTOMY: stable Pain:  Pain is well-controlled on PRN medications.  Heme: Hgb 11.0 and Hct 34.7 this am. Appropriate this am compared to pre-op levels and surgical losses.  CV: BP and HR stable. Continue to monitor with ordered vital signs.  GI:  Tolerating po: Yes     GU: Voiding  adequate amounts per pt.  FEN: Appropriate this am. No critical values.  Prophylaxis: intermittent pneumatic compression boots.  Plan: IV to saline lock Encourage ambulation IS to the room by RN Plan for possible discharge later today Continue plan of care per Dr. Denman George    LOS: 1 day     D  02/14/2018, 9:55 AM

## 2018-02-25 ENCOUNTER — Inpatient Hospital Stay: Payer: Medicaid Other | Attending: Obstetrics | Admitting: Obstetrics

## 2018-02-25 ENCOUNTER — Encounter: Payer: Self-pay | Admitting: Obstetrics

## 2018-02-25 VITALS — BP 111/76 | HR 102 | Temp 98.0°F | Resp 18 | Ht 64.0 in | Wt 97.0 lb

## 2018-02-25 DIAGNOSIS — N83201 Unspecified ovarian cyst, right side: Secondary | ICD-10-CM

## 2018-02-25 DIAGNOSIS — Z90721 Acquired absence of ovaries, unilateral: Secondary | ICD-10-CM

## 2018-02-25 NOTE — Progress Notes (Signed)
S: Patient is status post open (mini-lap) RSO 02/13/18 for a final pathology showing a teratoma. She is doing well. Normal bowel and bladder function. Denies fever. Denies pain.  O: Vitals:   02/25/18 1552  BP: 111/76  Pulse: (!) 102  Resp: 18  Temp: 98 F (36.7 C)  SpO2: 100%   Wound CDI. Normal induration under incision line. Abdomen soft, NTND.  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 RTC one month with M.Cross, NP for final wound check.  Cc: Arlina Robes, MD (Ref Ob/Gyn) Scot Jun, FNP (PCP)

## 2018-02-25 NOTE — Patient Instructions (Signed)
1. Activity restrictions as discussed 2. Return in one month with M.Cross for a final wound check

## 2018-03-08 ENCOUNTER — Telehealth: Payer: Self-pay | Admitting: *Deleted

## 2018-03-08 NOTE — Telephone Encounter (Signed)
Patient called and stated that "I am worried I am having a white discharge and feel full from surgery and back pain." Patient had surgery on 12/18, had a right salp/oop.  Patient answered no to the follow questions no odor, no problems with urination, Not having to wear a pad and not haivng to to cover incision. Per Lenna Sciara APP patient scheduled to see Dr. Gerarda Fraction on Monday

## 2018-03-11 ENCOUNTER — Encounter: Payer: Self-pay | Admitting: Obstetrics

## 2018-03-11 ENCOUNTER — Inpatient Hospital Stay (HOSPITAL_BASED_OUTPATIENT_CLINIC_OR_DEPARTMENT_OTHER): Payer: Medicaid Other | Admitting: Obstetrics

## 2018-03-11 ENCOUNTER — Inpatient Hospital Stay: Payer: Medicaid Other | Attending: Obstetrics

## 2018-03-11 VITALS — BP 102/68 | HR 91 | Temp 98.5°F | Resp 18 | Wt 97.0 lb

## 2018-03-11 DIAGNOSIS — Z90721 Acquired absence of ovaries, unilateral: Secondary | ICD-10-CM | POA: Insufficient documentation

## 2018-03-11 DIAGNOSIS — D279 Benign neoplasm of unspecified ovary: Secondary | ICD-10-CM | POA: Insufficient documentation

## 2018-03-11 DIAGNOSIS — M545 Low back pain, unspecified: Secondary | ICD-10-CM

## 2018-03-11 DIAGNOSIS — M549 Dorsalgia, unspecified: Secondary | ICD-10-CM | POA: Insufficient documentation

## 2018-03-11 DIAGNOSIS — R630 Anorexia: Secondary | ICD-10-CM | POA: Diagnosis not present

## 2018-03-11 DIAGNOSIS — R620 Delayed milestone in childhood: Secondary | ICD-10-CM

## 2018-03-11 DIAGNOSIS — N83201 Unspecified ovarian cyst, right side: Secondary | ICD-10-CM

## 2018-03-11 LAB — URINALYSIS, COMPLETE (UACMP) WITH MICROSCOPIC
Bilirubin Urine: NEGATIVE
GLUCOSE, UA: NEGATIVE mg/dL
Ketones, ur: NEGATIVE mg/dL
Leukocytes, UA: NEGATIVE
Nitrite: NEGATIVE
Protein, ur: NEGATIVE mg/dL
RBC / HPF: >50 RBC/hpf — ABNORMAL HIGH (ref 0–5)
Specific Gravity, Urine: 1.015 (ref 1.005–1.030)
pH: 5 (ref 5.0–8.0)

## 2018-03-11 NOTE — Patient Instructions (Signed)
Imaging will be ordered and we will let you know the results Urine will be checked. Keep your followup as scheduled

## 2018-03-11 NOTE — Progress Notes (Signed)
S: Patient is status post open (mini-lap) RSO 02/13/18 for a final pathology showing a teratoma. She has noted severe back pain and decreased appetite to the point the mother is concerned. She denies fever, nausea/emesis. States the pain is mid-low back and started about 3-4 weeks postop.  O: Vitals:   03/11/18 1319  BP: 102/68  Pulse: 91  Resp: 18  Temp: 98.5 F (36.9 C)  SpO2: 100%   Abdomen Wound CDI. Normal induration under incision line. Soft, NTND.  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 Check urine  Keep prior followup here as scheduled  Cc: Arlina Robes, MD (Ref Ob/Gyn) - not this visit Scot Jun, FNP (PCP)

## 2018-03-12 LAB — URINE CULTURE: Culture: NO GROWTH

## 2018-03-13 ENCOUNTER — Telehealth: Payer: Self-pay | Admitting: *Deleted

## 2018-03-13 NOTE — Telephone Encounter (Signed)
I called patient to inform her of her urine culture results.  Per Joylene John, NP there is no evidence of infection.  Patient verbalized understanding.

## 2018-03-15 ENCOUNTER — Ambulatory Visit (HOSPITAL_COMMUNITY)
Admission: RE | Admit: 2018-03-15 | Discharge: 2018-03-15 | Disposition: A | Discharge status: Home / Self Care | Payer: Medicaid Other | Source: Ambulatory Visit | Attending: Obstetrics | Admitting: Obstetrics

## 2018-03-15 DIAGNOSIS — M545 Low back pain, unspecified: Secondary | ICD-10-CM

## 2018-03-15 MED ORDER — IOHEXOL 300 MG/ML  SOLN
100.0000 mL | Freq: Once | INTRAMUSCULAR | Status: AC | PRN
  Administered 2018-03-15: 100 mL via INTRAVENOUS

## 2018-03-15 MED ORDER — SODIUM CHLORIDE (PF) 0.9 % IJ SOLN
INTRAMUSCULAR | Status: AC
  Filled 2018-03-15: qty 50

## 2018-03-18 ENCOUNTER — Telehealth: Payer: Self-pay | Admitting: *Deleted

## 2018-03-18 NOTE — Telephone Encounter (Signed)
I called patient to inform her of her results from her CT scan done on 03/15/2018.  Per Joylene John, NP the results show no cause for back pain, and a borderline prominence of stool in the rectum, there are some other findings but the will go over with the patient on 03/25/2018 for  Post-op 2 follow up.  Patient states that her bowels have not been moving, per Joylene John, NP start taking stool softners, one in the morning and one at night.  Patient verbalized understanding.

## 2018-03-25 ENCOUNTER — Inpatient Hospital Stay (HOSPITAL_BASED_OUTPATIENT_CLINIC_OR_DEPARTMENT_OTHER): Payer: Medicaid Other | Admitting: Gynecologic Oncology

## 2018-03-25 ENCOUNTER — Telehealth: Payer: Self-pay | Admitting: *Deleted

## 2018-03-25 ENCOUNTER — Encounter: Payer: Self-pay | Admitting: Gynecologic Oncology

## 2018-03-25 VITALS — BP 105/75 | HR 91 | Temp 98.5°F | Resp 20 | Ht 64.0 in | Wt 97.8 lb

## 2018-03-25 DIAGNOSIS — D270 Benign neoplasm of right ovary: Secondary | ICD-10-CM

## 2018-03-25 DIAGNOSIS — D279 Benign neoplasm of unspecified ovary: Secondary | ICD-10-CM

## 2018-03-25 NOTE — Progress Notes (Signed)
Gynecologic Oncology Follow Up Post-Op  S: Patient presents today for post-operative follow up.  She is status post open (mini-lap) RSO on12/18/19 with Dr. Precious Haws for a final pathology showing a mature cystic teratoma. She has noted to have severe mid-low back pain that began 3-4 weeks post-op and decreased appetite at her first post op visit which prompted a CT scan and urinalysis with culture.   CT on 03/15/2018: IMPRESSION: 1. A cause for the patient's back pain is not identified. 2. Interval removal of the large ovarian dermoid. 3. Borderline prominence of stool in the rectum but otherwise overall stool burden appears normal. 4. Pectus excavatum, Haller index 3.7. 5. 10 degrees of levoconvex scoliosis between T10 and L4.  -No growth on urine culture from 03/11/2018.  Today, she reports significant improvement in her symptoms and she states she no longer has the back discomfort.  Denies abdominal pain/incisional pain.  Tolerating diet with no nausea or emesis.  She continues to use the senna-kot for her bowels.  Bladder functioning without difficulty.  Incision healing with no drainage reported.  No concerns voiced.  O: Vitals:   03/25/18 1056  BP: 105/75  Pulse: 91  Resp: 20  Temp: 98.5 F (36.9 C)  SpO2: 100%   Alert, oriented x3, in no acute distress.  Abdominal incision healing.  Minimal amount of suture material visible in the lower aspect of the incision but patient did not want the material to be removed after attempts due to pain.  No evidence of infection or drainage. Normal induration under incision line.   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 Given information in spanish on scoliosis but advised that some of the information on the handout may not apply to her given the range of severities of the condition.  Advised to follow up with PCP and GYN. No follow up needed with GYN ONC at this time. She is to call the  office if the lower aspect of her incision does not heal or if the suture material becomes more visible so an appointment can be made for removal at that time.  Reportable signs and symptoms reviewed.  Cc: Scot Jun, FNP (PCP)

## 2018-03-25 NOTE — Patient Instructions (Signed)
No follow up necessary at this time.  Please call if the suture material at the bottom of your incision comes out and needs to be removed.  Please call for any needs.   Escoliosis Scoliosis  La escoliosis es una afeccin en la cual la columna vertebral se curva hacia un lado. Normalmente, la columna vertebral no se curva de lado a lado (lateralmente). Cuando una persona tiene escoliosis, la columna vertebral puede curvarse hacia la izquierda, hacia la derecha, o en ambas direcciones. La curvatura de la columna vertebral se mide en ngulos y grados. La escoliosis puede afectar a personas de cualquier edad, pero es ms frecuente en nios y adolescentes. Cules son las causas? La causa de la escoliosis no siempre se conoce. Las causas pueden ser las siguientes:  Un defecto congnito.  Una enfermedad que pueda causar problemas en los msculos o un desequilibrio en el cuerpo, como parlisis cerebral o distrofia muscular. Cules son los signos o los sntomas? Es posible que esta afeccin no cause ningn sntoma. Si tiene sntomas, pueden incluir los siguientes:  Inclinarse hacia un lado.  Pecho hundido u hombros desiguales.  Que un lado del cuerpo sea diferente o ms grande que el otro lado (asimetra).  Una curvatura anormal de la columna vertebral.  Dolor, que podra limitar la cantidad de [place] fsica.  Falta de aire.  Problemas para controlar la vejiga o los intestinos, como no saber cundo Garment/textile technologist o Landscape architect. Esto podra indicar que existe un dao nervioso. Cmo se diagnostica? Esta afeccin se diagnostica en funcin de lo siguiente:  Sus antecedentes mdicos.  Sus sntomas.  Un examen fsico. Esto puede incluir: ? Examen de los nervios, los msculos y los reflejos (examen neurolgico). ? Pruebas del movimiento de la columna (estudio de la amplitud de [place]).  Pruebas de diagnstico por imgenes, por ejemplo: ? Radiografas. ? Resonancia magntica (RM). Cmo se  trata? El tratamiento de esta afeccin depende de la gravedad de los sntomas. El tratamiento puede incluir:  Observacin para controlar que la escoliosis no empeore (progrese). Es posible que deba asistir a consultas regularmente con el mdico.  Un dispositivo ortopdico para la espalda para evitar que la escoliosis progrese. Podra necesitarse en momentos de rpido crecimiento (estirones puberales), como en la adolescencia.  Medicamentos para Best boy.  Fisioterapia.  Clementeen Hoof. Siga estas indicaciones en su casa: Si tiene un dispositivo ortopdico:  selo como se lo haya indicado el mdico. Quteselo solamente como se lo haya indicado el mdico.  Afloje el dispositivo ortopdico si siente hormigueo en los dedos de las manos o de los pies, o si estos se le entumecen o se le enfran y se tornan de Optician, dispensing.  Mantenga limpio el dispositivo ortopdico.  Si el dispositivo ortopdico no es impermeable: ? No deje que se moje. ? Cbralo con un envoltorio hermtico cuando tome un bao de inmersin o [place]. Indicaciones generales  [employer] de venta libre y los recetados solamente como se lo haya indicado el mdico.  No conduzca ni use maquinaria pesada mientras toma analgsicos recetados.  Si le recetaron fisioterapia, haga los ejercicios como se lo hayan indicado.  Antes de comenzar a Engineer, maintenance o actividad fsica nuevos, pregntele al mdico si lo puede realizar sin correr [employer].  Concurra a todas las visitas de seguimiento como se lo haya indicado el mdico. Esto es importante. Comunquese con un mdico si tiene:  Problemas con el dispositivo ortopdico para la espalda, como irritacin en la  piel o molestias.  Dolor en la espalda que no se alivia con medicamentos. Solicite ayuda inmediatamente si:  Siente debilidad en las piernas.  No puede mover las piernas.  No puede controlar cundo Production assistant, radio (prdida del control de la  vejiga o los intestinos). Resumen  La escoliosis es una afeccin en la que columna se curva lateralmente. La columna vertebral puede curvarse hacia la izquierda, hacia la derecha, o en ambas direcciones.  Esta afeccin podra ser consecuencia de defectos congnitos o de enfermedades que [employer] msculos y el equilibrio Producer, television/film/video.  Siga las indicaciones del mdico sobre el uso del dispositivo ortopdico, la realizacin de actividades fsicas y la asistencia a las consultas de control. Esta informacin no tiene Marine scientist el consejo del mdico. Asegrese de hacerle al mdico cualquier pregunta que tenga. Document Released: 11/23/2004 Document Revised: 08/24/2017 Document Reviewed: 08/24/2017 Elsevier Interactive Patient Education  2019 [employer].

## 2018-03-25 NOTE — Telephone Encounter (Signed)
Routed CT Scan results from 03/15/2018 to Molli Barrows, [place].

## 2018-05-28 ENCOUNTER — Encounter: Payer: Self-pay | Admitting: Obstetrics

## 2018-05-28 ENCOUNTER — Telehealth: Payer: Self-pay | Admitting: *Deleted

## 2018-05-28 NOTE — Telephone Encounter (Signed)
Patient called and stated that "I have a bubble at my wound site. It's uncomfortable but no pain." Message forwarded to the RN

## 2018-11-07 IMAGING — US US PELVIS COMPLETE
1 series · 15 of 25 positions shown · non-contrast
Comparison: None.

CLINICAL DATA: Abnormal lab test, testosterone

EXAM:
TRANSABDOMINAL ULTRASOUND OF PELVIS
TECHNIQUE: Transabdominal ultrasound examination of the pelvis was performed
including evaluation of the uterus, ovaries, adnexal regions, and
pelvic cul-de-sac.

[Series 1: us pelvis complete · 38 acquisitions, 15 frames shown]
[im 1/38]
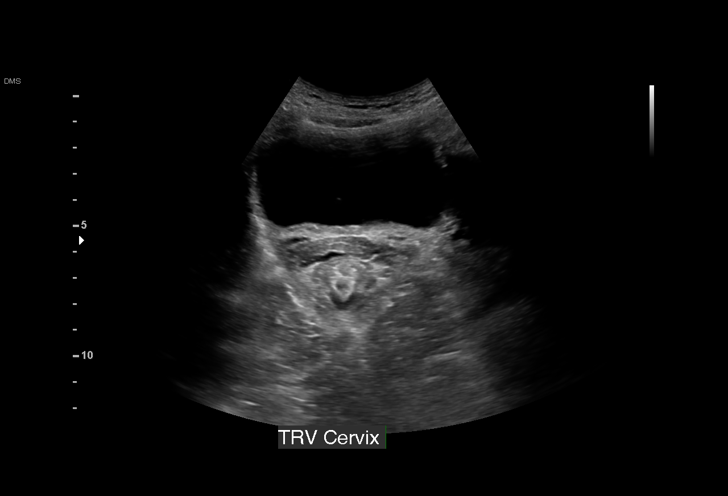
[im 4/38]
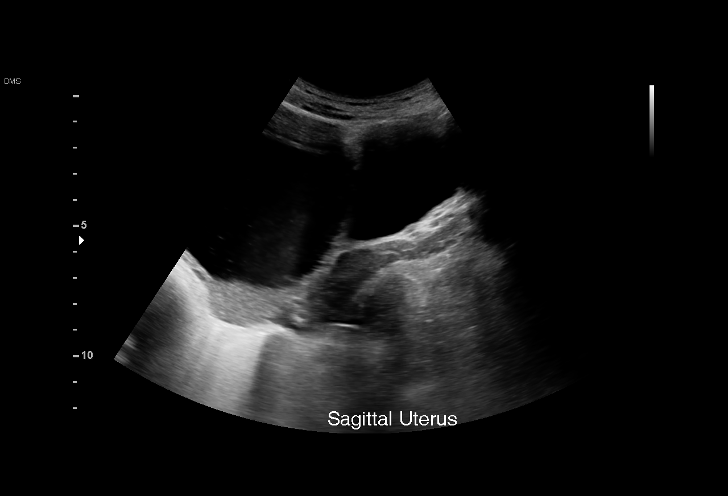
[im 7/38]
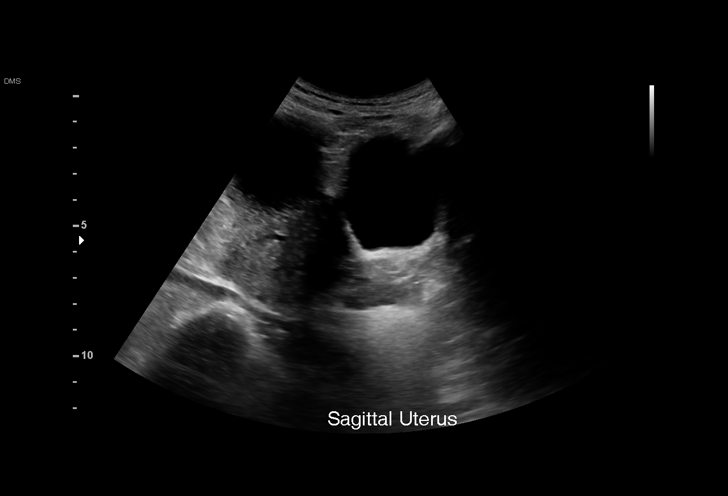
[im 8/38]
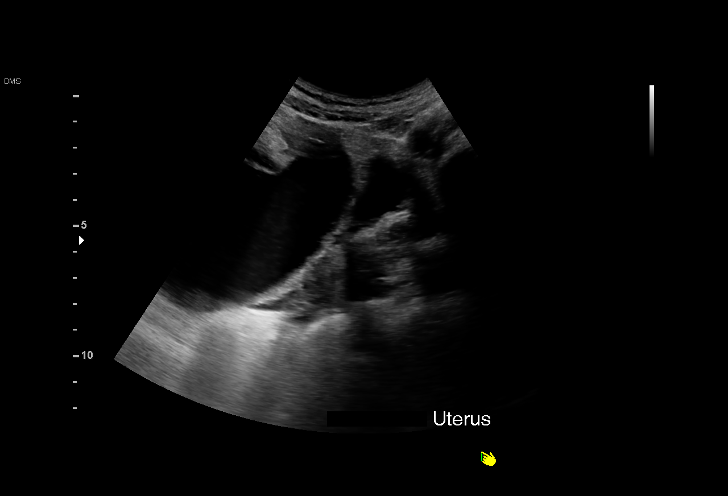
[im 11/38]
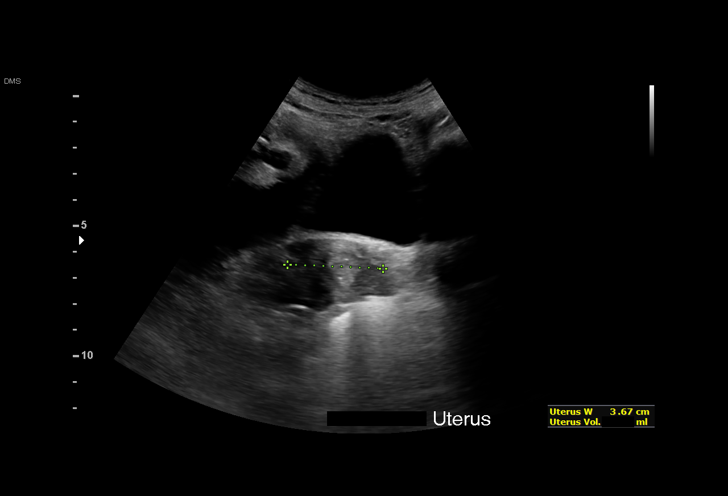
[im 14/38]
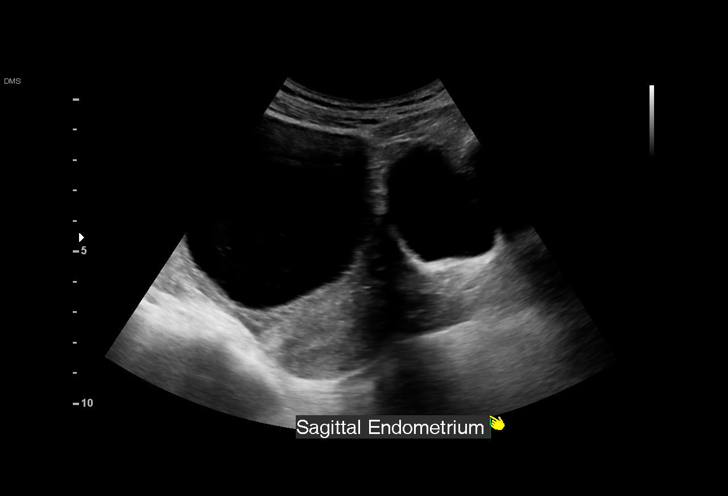
[im 16/38]
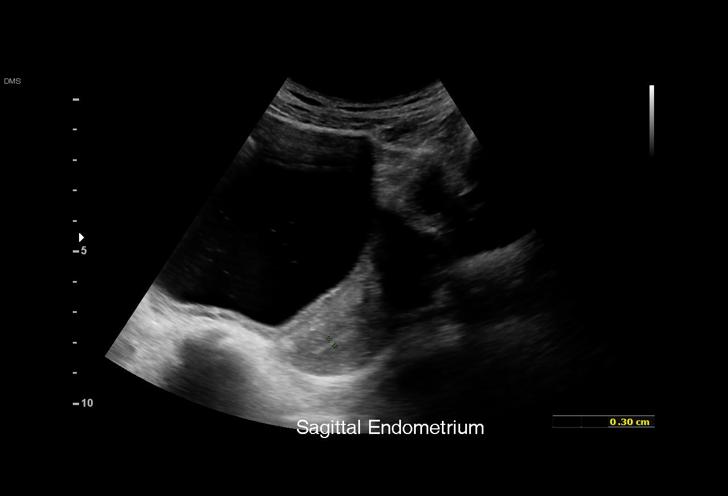
[im 19/38]
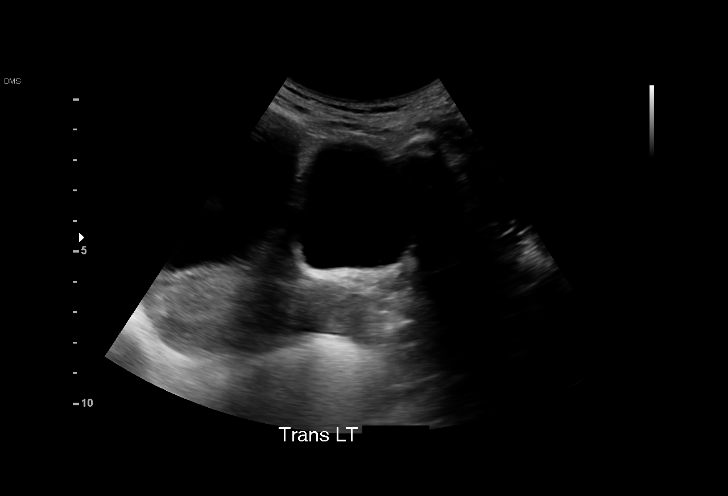
[im 22/38]
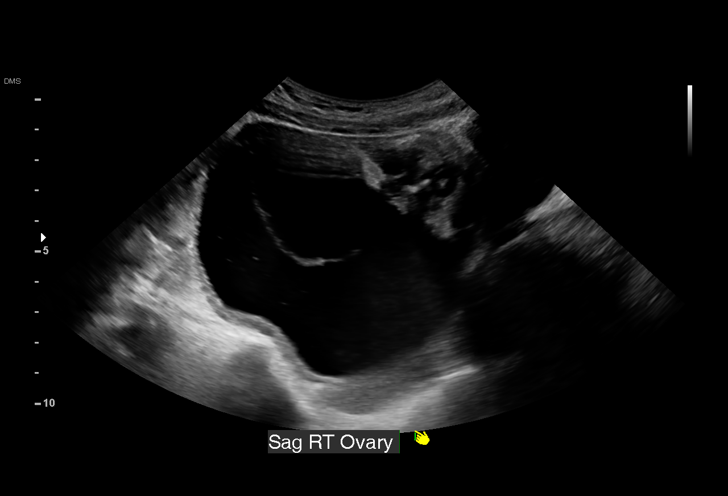
[im 24/38]
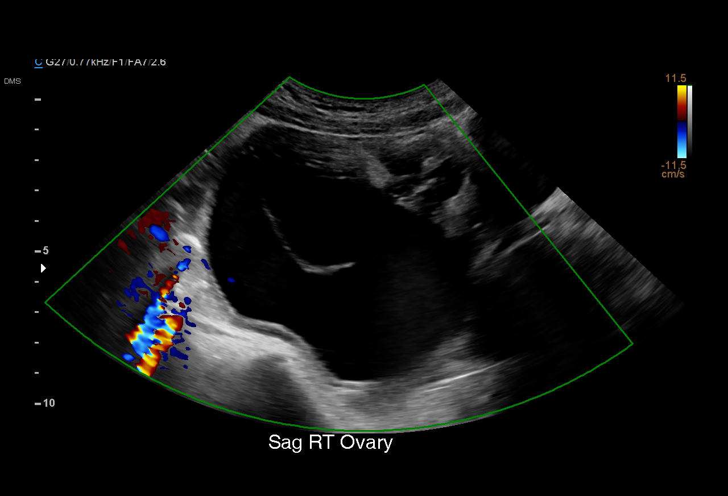
[im 27/38]
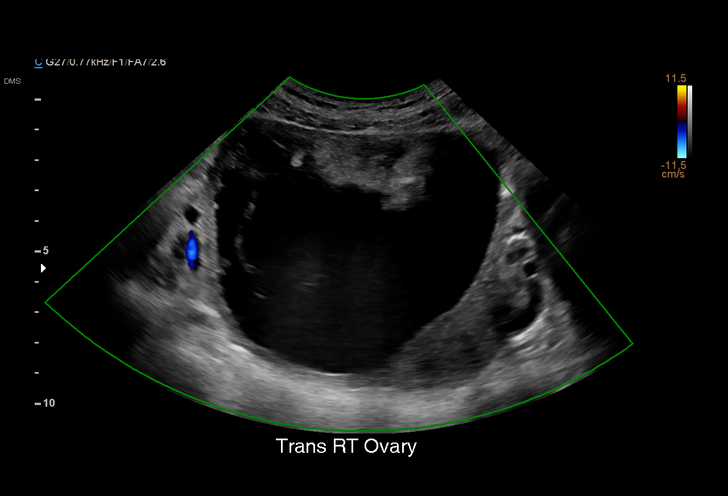
[im 30/38]
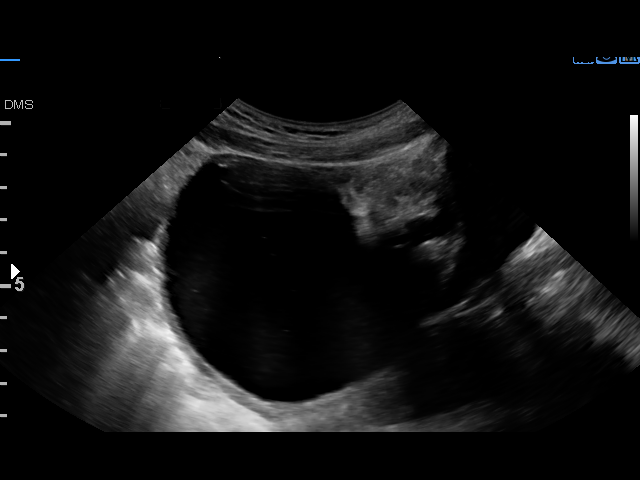
[im 31/38]
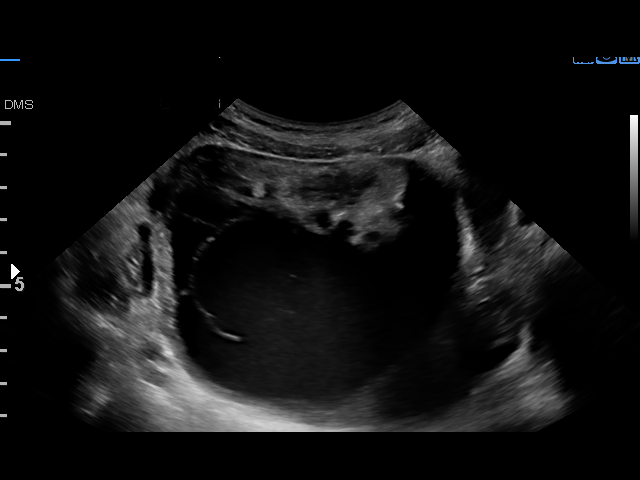
[im 34/38]
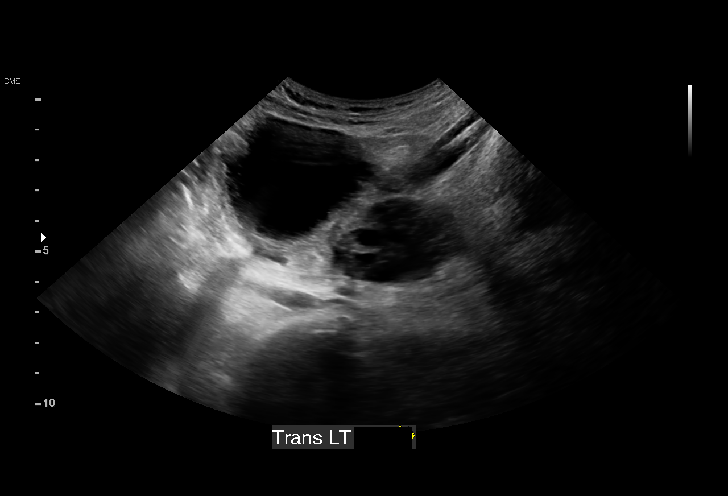
[im 38/38]
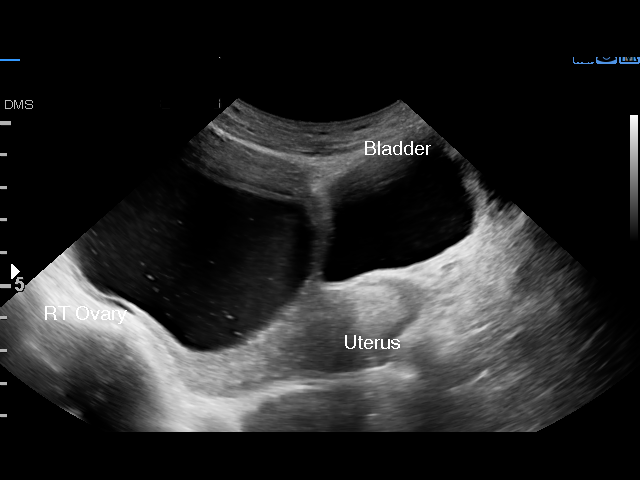

[15 of 25 positions shown; findings below may reference images not displayed]

FINDINGS: Uterus

Measurements: 7.4 x 3.1 x 3.7 cm. No fibroids or other mass
visualized.

Endometrium

Thickness: 3 mm in thickness.  No focal abnormality visualized.

Right ovary

Measurements: 10.3 x 9.1 x 11.9 cm. 9.6 x 8.4 x 8.9 cm complex
cystic mass within the right ovary. Thin internal septations.
Internal echoes noted. There is mural nodularity with a soft tissue
component noted along the anterior aspect of the cystic mass
measuring up to 4 cm.

Left ovary

Measurements: 4.4 x 2.7 x 4.4 cm. Normal appearance/no adnexal mass.

Other findings:  No abnormal free fluid.
IMPRESSION: Large complex right ovarian cystic mass measuring up to 9.6 cm.
There is a moderate-sized soft tissue mural component/nodule
measuring up to 4 cm. Recommend surgical consultation for possible
surgical excision.

## 2019-03-19 IMAGING — CT CT ABD-PELV W/ CM
2 of 5 series · 16 of 46 positions shown, 18 images · IV contrast (omnipaque)
Comparison: Ultrasound on 01/01/2018

CLINICAL DATA: Palpable mass in lower abdomen and pelvis. Weight
loss. Cystic mass on ultrasound.

EXAM:
CT ABDOMEN AND PELVIS WITH CONTRAST
TECHNIQUE: Multidetector CT imaging of the abdomen and pelvis was performed
using the standard protocol following bolus administration of
intravenous contrast.
CONTRAST:  75mL OMNIPAQUE IOHEXOL 300 MG/ML  SOLN

[Series 2: axial st · axial · 0.63mm/px · z∈[-860,-464]mm · 13 of 93 slices shown, 15 images]
[im 7/93  soft-tissue]
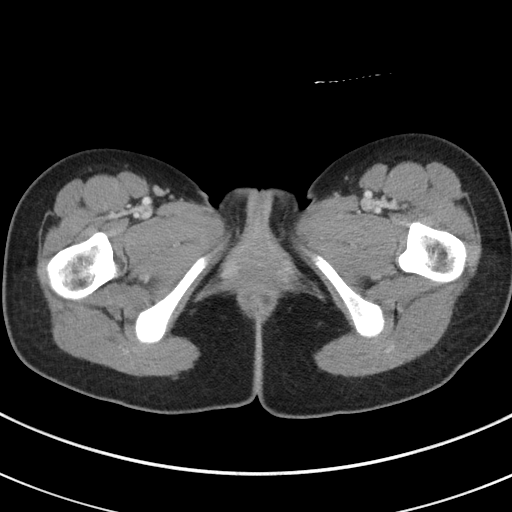
[im 7/93  bone]
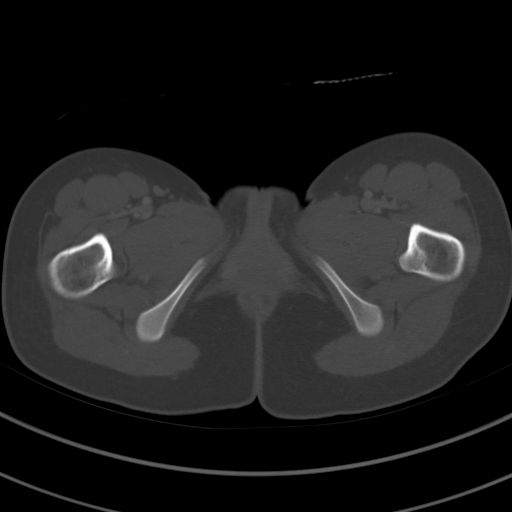
[im 13/93  soft-tissue]
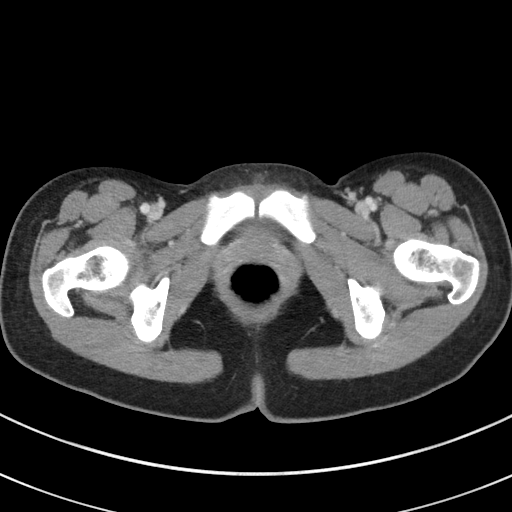
[im 19/93  soft-tissue]
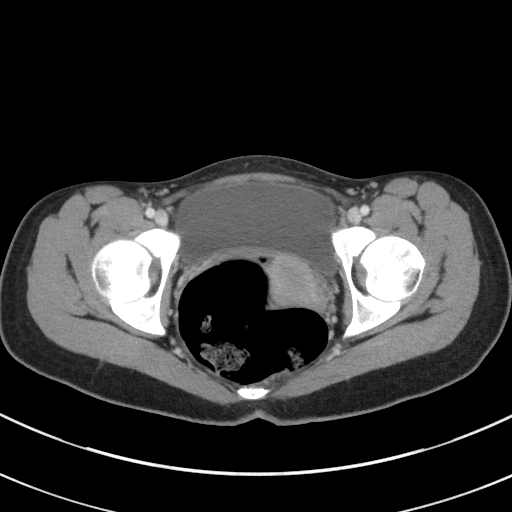
[im 25/93  soft-tissue]
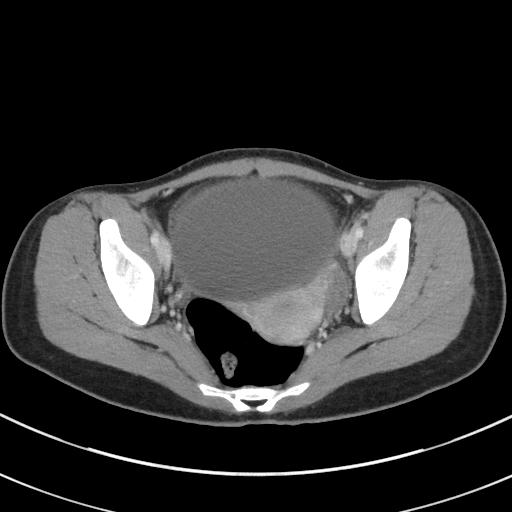
[im 31/93  soft-tissue]
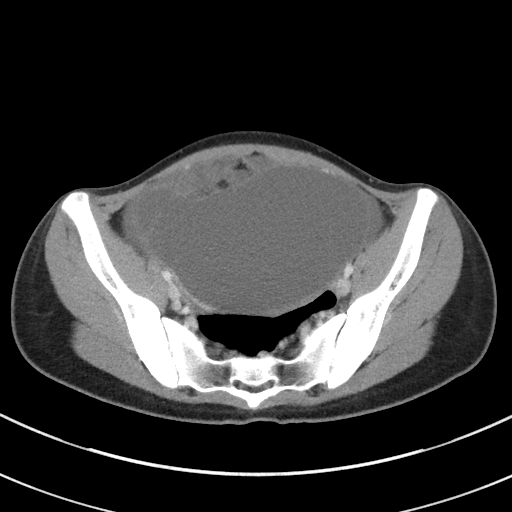
[im 37/93  soft-tissue]
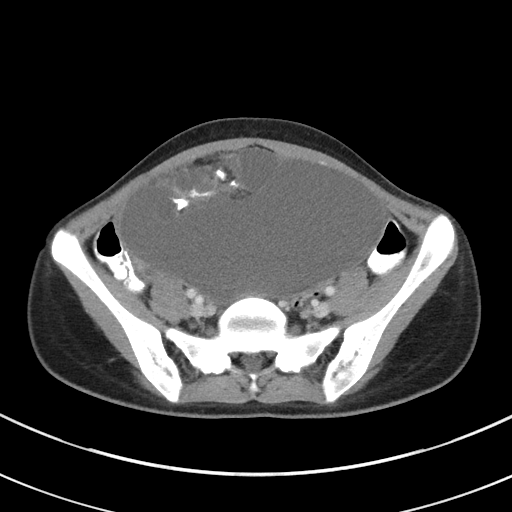
[im 50/93  soft-tissue]
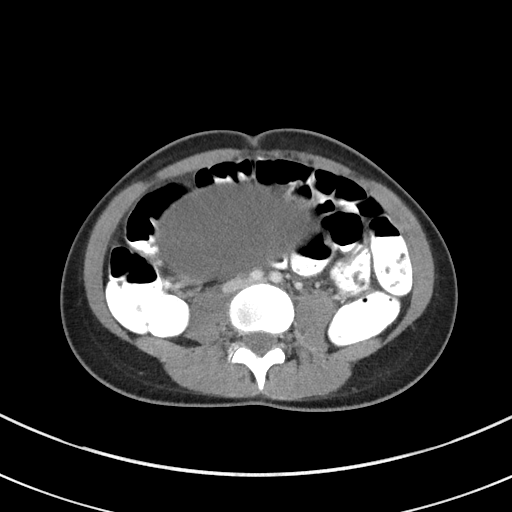
[im 56/93  soft-tissue]
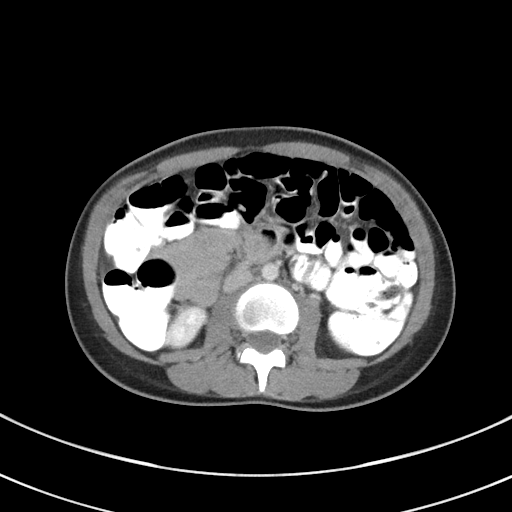
[im 62/93  soft-tissue]
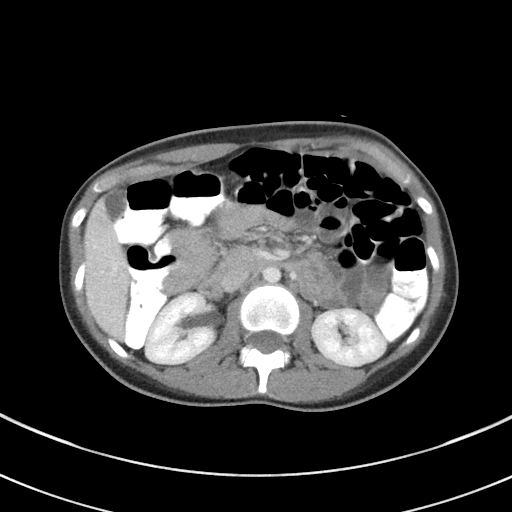
[im 62/93  bone]
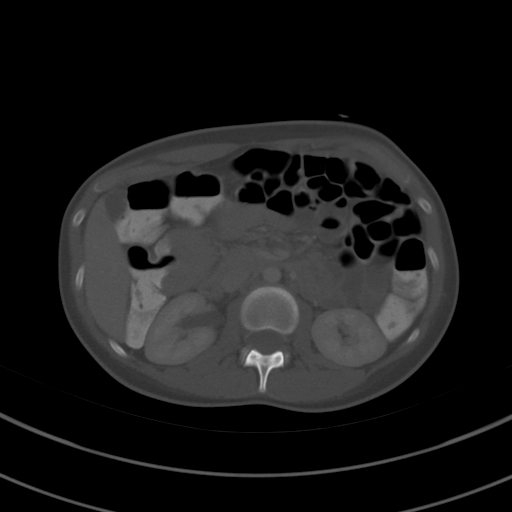
[im 68/93  soft-tissue]
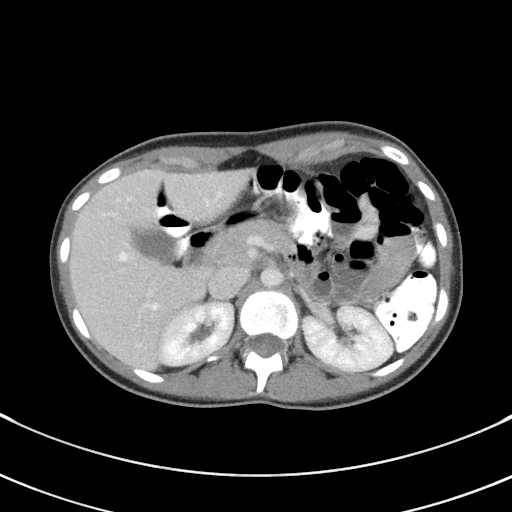
[im 74/93  soft-tissue]
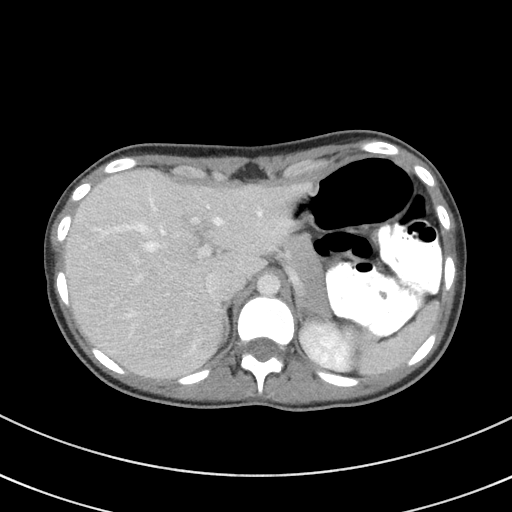
[im 80/93  soft-tissue]
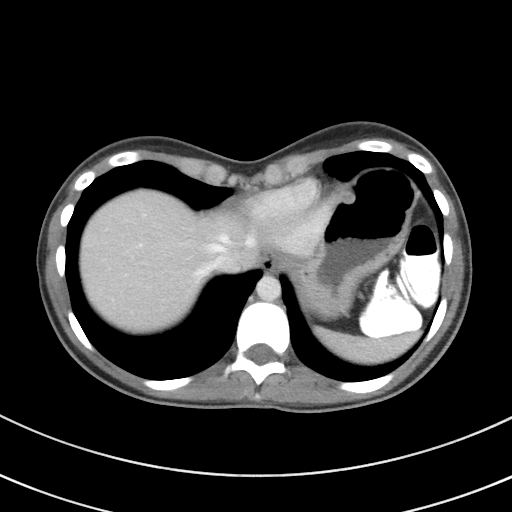
[im 86/93  soft-tissue]
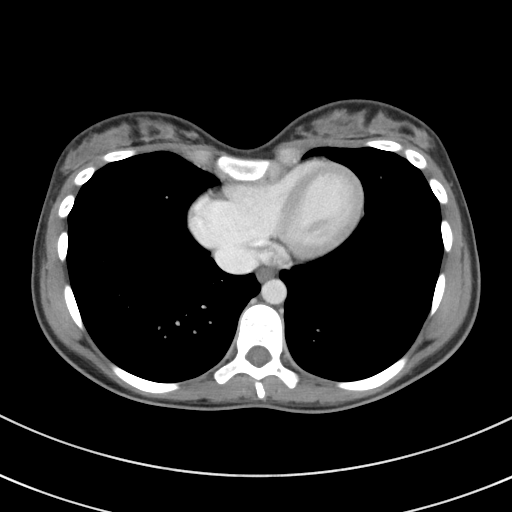

[Series 5: coronal st · coronal · 0.68mm/px · 3 of 64 slices shown]
[im 22/64  soft-tissue]
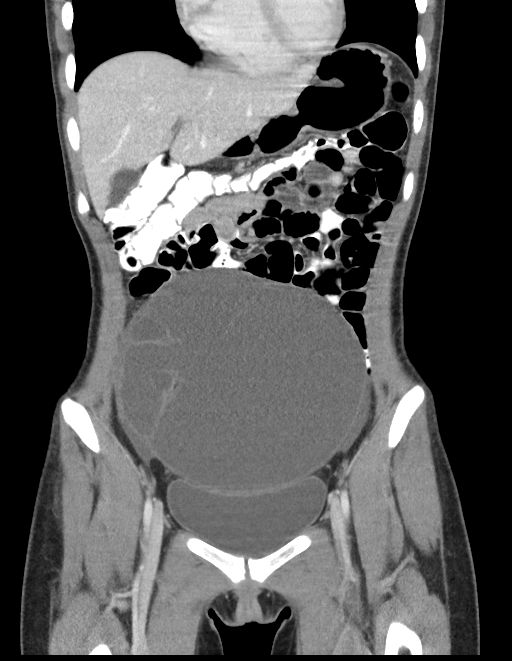
[im 29/64  soft-tissue]
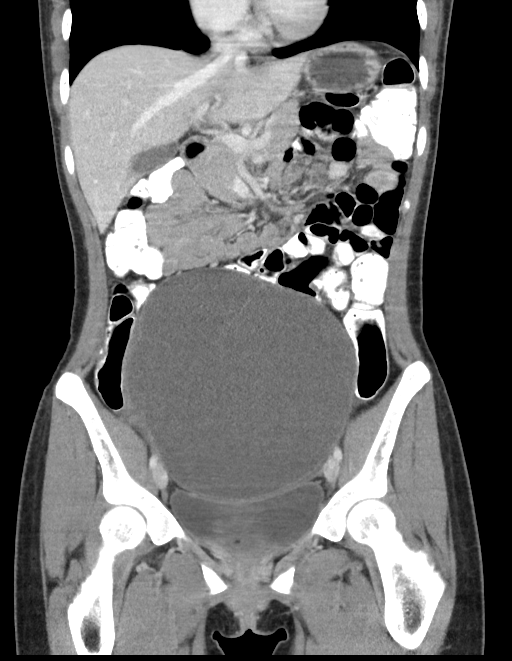
[im 36/64  soft-tissue]
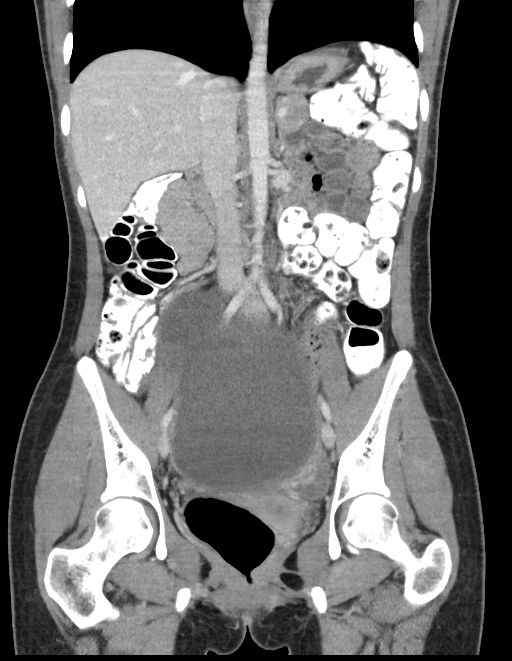

[16 of 46 positions shown; findings below may reference images not displayed]

FINDINGS: Lower Chest: No acute findings.

Hepatobiliary: No hepatic masses identified. Gallbladder is
unremarkable.

Pancreas:  No mass or inflammatory changes.

Spleen: Within normal limits in size and appearance.

Adrenals/Urinary Tract: No masses identified. No evidence of
hydronephrosis. Unremarkable unopacified urinary bladder.

Stomach/Bowel: No evidence of obstruction, inflammatory process or
abnormal fluid collections.

Vascular/Lymphatic: No pathologically enlarged lymph nodes. No
abdominal aortic aneurysm.

Reproductive:

-- Uterus: No fibroids or other masses identified.

-- Right ovary: A large cystic and solid mass is seen which is
centered in the right adnexa extends into the lower and mid abdomen.
Solid components within this mass containing macroscopic fat and
calcification. This measures 16.8 x 9.5 by 15.8 cm, and is
consistent with a benign ovarian dermoid.

-- Left ovary:  Appears normal.

Other:  No evidence of ascites.

Musculoskeletal:  No suspicious bone lesions identified.
IMPRESSION: Large benign right ovarian dermoid measuring 16.8 cm.

No other significant abnormality within the abdomen or pelvis.
